# Patient Record
Sex: Female | Born: 1990 | Race: Black or African American | Hispanic: No | Marital: Single | Smoking: Never smoker
Health system: Southern US, Community
[De-identification: ages and names within clinical notes are randomized; demographics above are authoritative.]

## PROBLEM LIST (undated history)

## (undated) DIAGNOSIS — K8020 Calculus of gallbladder without cholecystitis without obstruction: Secondary | ICD-10-CM

## (undated) DIAGNOSIS — D509 Iron deficiency anemia, unspecified: Secondary | ICD-10-CM

## (undated) DIAGNOSIS — K760 Fatty (change of) liver, not elsewhere classified: Secondary | ICD-10-CM

## (undated) DIAGNOSIS — K219 Gastro-esophageal reflux disease without esophagitis: Secondary | ICD-10-CM

## (undated) DIAGNOSIS — E119 Type 2 diabetes mellitus without complications: Secondary | ICD-10-CM

## (undated) DIAGNOSIS — E669 Obesity, unspecified: Secondary | ICD-10-CM

## (undated) HISTORY — PX: BREAST SURGERY: SHX581

## (undated) HISTORY — DX: Obesity, unspecified: E66.9

## (undated) HISTORY — DX: Gastro-esophageal reflux disease without esophagitis: K21.9

## (undated) HISTORY — DX: Calculus of gallbladder without cholecystitis without obstruction: K80.20

## (undated) HISTORY — DX: Iron deficiency anemia, unspecified: D50.9

## (undated) HISTORY — DX: Fatty (change of) liver, not elsewhere classified: K76.0

---

## 2017-12-18 ENCOUNTER — Emergency Department (HOSPITAL_COMMUNITY): Payer: Self-pay

## 2017-12-18 ENCOUNTER — Encounter (HOSPITAL_COMMUNITY): Payer: Self-pay

## 2017-12-18 ENCOUNTER — Emergency Department (HOSPITAL_COMMUNITY)
Admission: EM | Admit: 2017-12-18 | Discharge: 2017-12-19 | Disposition: A | Discharge status: Home / Self Care | Payer: Self-pay | Attending: Emergency Medicine | Admitting: Emergency Medicine

## 2017-12-18 ENCOUNTER — Other Ambulatory Visit: Payer: Self-pay

## 2017-12-18 DIAGNOSIS — W231XXA Caught, crushed, jammed, or pinched between stationary objects, initial encounter: Secondary | ICD-10-CM | POA: Insufficient documentation

## 2017-12-18 DIAGNOSIS — Y929 Unspecified place or not applicable: Secondary | ICD-10-CM | POA: Insufficient documentation

## 2017-12-18 DIAGNOSIS — S92412A Displaced fracture of proximal phalanx of left great toe, initial encounter for closed fracture: Secondary | ICD-10-CM | POA: Insufficient documentation

## 2017-12-18 DIAGNOSIS — Y990 Civilian activity done for income or pay: Secondary | ICD-10-CM | POA: Insufficient documentation

## 2017-12-18 DIAGNOSIS — Y939 Activity, unspecified: Secondary | ICD-10-CM | POA: Insufficient documentation

## 2017-12-18 MED ORDER — LIDOCAINE HCL (PF) 1 % IJ SOLN
5.0000 mL | Freq: Once | INTRAMUSCULAR | Status: AC
  Administered 2017-12-18: 5 mL
  Filled 2017-12-18: qty 30

## 2017-12-18 MED ORDER — OXYCODONE-ACETAMINOPHEN 5-325 MG PO TABS
1.0000 | ORAL_TABLET | Freq: Once | ORAL | Status: AC
  Administered 2017-12-18: 1 via ORAL
  Filled 2017-12-18: qty 1

## 2017-12-18 NOTE — ED Notes (Signed)
Bed: WTR6 Expected date:  Expected time:  Means of arrival:  Comments: EMS 27 yo female from triage-tripped over a pallet-toe injury

## 2017-12-18 NOTE — ED Provider Notes (Signed)
[facility]-EMERGENCY DEPT Provider Note   CSN: [id number] Arrival date & time: 12/18/17  2114     History   Chief Complaint Chief Complaint  Patient presents with  . Foot Pain    HPI Norma Bender is a 27 y.o. female who presents the emergency department today for left great toe pain that began around 8:30 PM tonight.  Patient states that she was at work and she "tripped" over a pallet and dropped a box that hit her in her left great toe.  She notes that she now has a deformity of the left great toe with a large amount of pain.  Pain is worse with palpation and also attempts for movement.  She denies any numbness/tingling.  She was transferred to EMS and ice has been applied.  No other interventions prior to arrival.  No open wounds.  HPI  No past medical history on file.  There are no active problems to display for this patient.  No significant past medical history reported  OB History   None      Home Medications    Prior to Admission medications   Not on File    Family History No family history on file.  Social History Social History   Tobacco Use  . Smoking status: Not on file  Substance Use Topics  . Alcohol use: Never    Frequency: Never  . Drug use: Never     Allergies   Patient has no known allergies.   Review of Systems Review of Systems  All other systems reviewed and are negative.    Physical Exam Updated Vital Signs BP 136/76 (BP Location: Left Arm)   Pulse 91   Temp 98.2 F (36.8 C) (Oral)   Resp 18   Ht  (1.676 m)   Wt 104.3 kg (230 lb)   LMP 12/04/2017 (Approximate)   SpO2 99%   BMI 37.12 kg/m   Physical Exam  Constitutional: She appears well-developed and well-nourished.  HENT:  Head: Normocephalic and atraumatic.  Right Ear: External ear normal.  Left Ear: External ear normal.  Eyes: Conjunctivae are normal. Right eye exhibits no discharge. Left eye exhibits no discharge. No scleral  icterus.  Cardiovascular:  Pulses:      Dorsalis pedis pulses are 2+ on the left side.       Posterior tibial pulses are 2+ on the left side.  Pulmonary/Chest: Effort normal. No respiratory distress.  Musculoskeletal:       Left ankle: Normal. Achilles tendon normal.  Left great toe with angulated deformity without break of the skin.  Tenderness palpation over deformity.  Normal sensation distal to this.  Good cap refill less than 2 seconds.  Dorsalis pedis and posterior tib pulses 2+.  No tenderness palpation of the remainder of the left foot.  Neurological: She is alert. No sensory deficit.  Normal sensation distal to the injury  Skin: Skin is warm and dry. Capillary refill takes less than 2 seconds. No pallor.  Skin intact  Psychiatric: She has a normal mood and affect.  Nursing note and vitals reviewed.    ED Treatments / Results  Labs (all labs ordered are listed, but only abnormal results are displayed) Labs Reviewed - No data to display  EKG None  Radiology Dg Foot Complete Left  Result Date: 12/18/2017 CLINICAL DATA:  Second postreduction EXAM: LEFT FOOT - COMPLETE 3+ VIEW COMPARISON:  12/18/2017 FINDINGS: Comminuted fracture of the proximal phalanx of the left  first toe again identified. There is improved alignment since the previous study with some residual dorsal angulation of the distal fracture fragments. IMPRESSION: Fractures of the proximal phalanx left first toe with improved alignment. Some residual dorsal angulation of the distal fracture fragments is identified. Electronically Signed   By: Burman Nieves M.D.   On: 12/18/2017 23:53   Dg Foot Complete Left  Result Date: 12/18/2017 CLINICAL DATA:  Left big toe fracture status post reduction EXAM: LEFT FOOT - COMPLETE 3+ VIEW COMPARISON:  None. FINDINGS: Mildly comminuted fracture of distal aspect of first proximal phalanx with a fracture cleft extending to the articular surface of the first IP joint. Mild apex  lateral angulation. No other fracture or dislocation. No aggressive osseous lesion. Soft tissue swelling of the great toe. IMPRESSION: 1. Mildly comminuted fracture of distal aspect of first proximal phalanx with a fracture cleft extending to the articular surface of the first IP joint and mild apex lateral angulation. Electronically Signed   By: Elige Ko   On: 12/18/2017 23:36   Dg Foot Complete Left  Result Date: 12/18/2017 CLINICAL DATA:  Fall with left great toe injury EXAM: LEFT FOOT - COMPLETE 3+ VIEW COMPARISON:  None. FINDINGS: There is a comminuted fracture of the distal aspect of the proximal phalanx of left great toe that extends to the articular surface of the first interphalangeal joint. There is mild dorsal and lateral angulation. IMPRESSION: Comminuted, dorsally and laterally angulated, intra-articular fracture of the head of the first proximal phalanx of the left foot. Electronically Signed   By: Deatra Robinson M.D.   On: 12/18/2017 21:53    Procedures Reduction of fracture Date/Time: 12/19/2017 12:14 AM Performed by: Jacinto Halim, PA-C Authorized by: Jacinto Halim, PA-C  Consent: Verbal consent obtained. Risks and benefits: risks, benefits and alternatives were discussed Consent given by: patient Patient understanding: patient states understanding of the procedure being performed Patient consent: the patient's understanding of the procedure matches consent given Test results: test results available and properly labeled Site marked: the operative site was marked Imaging studies: imaging studies available Patient identity confirmed: verbally with patient Time out: Immediately prior to procedure a "time out" was called to verify the correct patient, procedure, equipment, support staff and site/side marked as required. Local anesthesia used: Digital block.  Anesthesia: Local anesthesia used: Digital block.  Sedation: Patient sedated: no  Patient tolerance:  Patient tolerated the procedure well with no immediate complications Comments: Successful improvement of alignment status post reduction.  Celedonio Miyamoto Block Date/Time: 12/19/2017 12:16 AM Performed by: Jacinto Halim, PA-C Authorized by: Jacinto Halim, PA-C   Consent:    Consent obtained:  Verbal   Consent given by:  Patient   Risks discussed:  Allergic reaction, bleeding, intravenous injection, infection, nerve damage, pain, unsuccessful block and swelling   Alternatives discussed:  No treatment Indications:    Indications:  Pain relief and procedural anesthesia Location:    Body area:  Lower extremity   Lower extremity nerve blocked: Left great toe digital block.   Laterality:  Left Pre-procedure details:    Skin preparation:  Alcohol Procedure details (see MAR for exact dosages):    Block needle gauge:  25 G   Anesthetic injected:  Lidocaine 1% w/o epi   Injection procedure:  Anatomic landmarks identified, anatomic landmarks palpated, introduced needle, incremental injection and negative aspiration for blood   Paresthesia:  None Post-procedure details:    Dressing:  None   Outcome:  Anesthesia achieved  Patient tolerance of procedure:  Tolerated well, no immediate complications   (including critical care time)  Medications Ordered in ED Medications  oxyCODONE-acetaminophen (PERCOCET/ROXICET) 5-325 MG per tablet 1 tablet (1 tablet Oral Given 12/18/17 2220)  lidocaine (PF) (XYLOCAINE) 1 % injection 5 mL (5 mLs Other Given by Other 12/18/17 2354)     Initial Impression / Assessment and Plan / ED Course  I have reviewed the triage vital signs and the nursing notes.  Pertinent labs & imaging results that were available during my care of the patient were reviewed by me and considered in my medical decision making (see chart for details).     27 y.o. female with closed, neurovascularly intact comminuted, dorsally and laterally angulated, intra-articular fracture of the head  of the first proximal phalanx of the left foot. Discussed case with Dr. Carola Frost who recommended digital block with reduction and follow up in his office on Wednesday.   Block performed with successful reduction and improvement of alignment with postreduction films.  Patient remains neurovascular intact.  Buddy tape was applied and patient was placed in a postop shoe.   Will discharge the patient home with a short course of pain medication until she is able to follow-up in Dr. Sherolyn Buba.  Will provide note for work. Specific return precautions discussed. Time was given for all questions to be answered. The patient verbalized understanding and agreement with plan. The patient appears safe for discharge home.  Final Clinical Impressions(s) / ED Diagnoses   Final diagnoses:  Closed displaced fracture of proximal phalanx of left great toe, initial encounter    ED Discharge Orders    None       Princella Pellegrini 12/19/17 0018    Bethann Berkshire, MD 12/19/17 [phone]

## 2017-12-18 NOTE — ED Triage Notes (Addendum)
Per EMS report:  Pt was at work [employer] and tripped over a pallet and felt a sharp pain to LT foot.  LT great toe is deformed but no bleeding noted.  Ice and elevation by EMS.  Was "hopping" around on scene.  Pedal pulses palpable by EMS.   Pt stated she was wearing steel-toed boots

## 2017-12-19 MED ORDER — OXYCODONE-ACETAMINOPHEN 5-325 MG PO TABS: 1.0000 | ORAL_TABLET | Freq: Four times a day (QID) | ORAL | 0 refills | Status: DC | PRN

## 2017-12-19 NOTE — Discharge Instructions (Addendum)
You were seen here today for a comminuted, dorsally and laterally angulated, intra-articular fracture of the head of the first proximal phalanx of the left foot. You received reduction with improvement of alignment in the department.  I would like you to please call Dr. Magdalene Patricia office to schedule a follow-up appointment on Wednesday.  Please remain in post op shoe until follow up.  Please follow RICE therapy. ICE the area 3 times per day for at least 20 minutes. Elevate above the level of the heart anytime you are not using it.   HOW TO MAKE AN ICE PACK  To make an ice pack, do one of the following:  Place crushed ice or a bag of frozen vegetables in a sealable plastic bag. Squeeze out the excess air. Place this bag inside another plastic bag. Slide the bag into a pillowcase or place a damp towel between your skin and the bag.  Mix 3 parts water with 1 part rubbing alcohol. Freeze the mixture in a sealable plastic bag. When you remove the mixture from the freezer, it will be slushy. Squeeze out the excess air. Place this bag inside another plastic bag. Slide the bag into a pillowcase or place a damp towel between your skin and the ice pack.   For breakthrough pain you may take Percocet. Do not drink alcohol drive or operate heavy machinery when taking. You are being provided a prescription for opiates (also known as narcotics) for pain control on an ?as needed? basis.  Opiates can be addictive and should only be used when absolutely necessary for pain control when other alternatives do not work.  We recommend you only use them for the recommended amount of time and only as prescribed.  Please do not take with other sedative medications or alcohol.  Please do not drive, operate machinery, or make important decisions while taking opiates.  Please note that these medications can be addictive and have high abuse potential.  Please keep these medications locked away from children, teenagers or any family  members with history of substance abuse. Additionally, these medications may cause constipation - take over the counter stool softeners or add fiber to your diet to treat this (Metamucil, Psyllium Fiber, Colace, Miralax) Further refills will need to be obtained from your primary care doctor and will not be prescribed through the Emergency Department. You will test positive on most drug tests while taking this medication.   If you develop worsening or new concerning symptoms you can return to the emergency department for re-evaluation.

## 2017-12-19 NOTE — Consult Note (Signed)
[facility] Consultation  Reason for Consult: Left great toe deformity Referring Physician: Valeria Batman, MD and Leary Roca, PA-C  Norma Bender is an 27 y.o. female.  HPI: Patient sustained blow to left foot from a wood pallet then heavy box or crate that landed directly on her great toe. She was wearing protective work boots at the time. Patient has since undergone digital block and attempt at reduction with Leary Roca, PA-C. Substantial improvement in alignment but deformity and malrotation persists. Patient denies other injuries. She also denies smoking or other significant medical problems.  No past medical history on file.  No family history on file.  Social History:  reports that she does not drink alcohol or use drugs. Her tobacco history is not on file.  Allergies: Allergies no known allergies  Medications: Prior to Admission: None  No results found for this or any previous visit (from the past 48 hour(s)).  Dg Foot Complete Left  Result Date: 12/18/2017 CLINICAL DATA:  Second postreduction EXAM: LEFT FOOT - COMPLETE 3+ VIEW COMPARISON:  12/18/2017 FINDINGS: Comminuted fracture of the proximal phalanx of the left first toe again identified. There is improved alignment since the previous study with some residual dorsal angulation of the distal fracture fragments. IMPRESSION: Fractures of the proximal phalanx left first toe with improved alignment. Some residual dorsal angulation of the distal fracture fragments is identified. Electronically Signed   By: Burman Nieves M.D.   On: 12/18/2017 23:53   Dg Foot Complete Left  Result Date: 12/18/2017 CLINICAL DATA:  Left big toe fracture status post reduction EXAM: LEFT FOOT - COMPLETE 3+ VIEW COMPARISON:  None. FINDINGS: Mildly comminuted fracture of distal aspect of first proximal phalanx with a fracture cleft extending to the articular surface of the first IP joint. Mild apex lateral angulation. No other  fracture or dislocation. No aggressive osseous lesion. Soft tissue swelling of the great toe. IMPRESSION: 1. Mildly comminuted fracture of distal aspect of first proximal phalanx with a fracture cleft extending to the articular surface of the first IP joint and mild apex lateral angulation. Electronically Signed   By: Elige Ko   On: 12/18/2017 23:36   Dg Foot Complete Left  Result Date: 12/18/2017 CLINICAL DATA:  Fall with left great toe injury EXAM: LEFT FOOT - COMPLETE 3+ VIEW COMPARISON:  None. FINDINGS: There is a comminuted fracture of the distal aspect of the proximal phalanx of left great toe that extends to the articular surface of the first interphalangeal joint. There is mild dorsal and lateral angulation. IMPRESSION: Comminuted, dorsally and laterally angulated, intra-articular fracture of the head of the first proximal phalanx of the left foot. Electronically Signed   By: Deatra Robinson M.D.   On: 12/18/2017 21:53    ROS noncontributory with no recent fever, bleeding abnormalities, urologic dysfunction, GI problems, or weight gain reported.  Blood pressure 139/79, pulse 78, temperature 99 F (37.2 C), temperature source Oral, resp. rate 18, height  (1.676 m), weight 104.3 kg (230 lb), last menstrual period 12/04/2017, SpO2 98 %. 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 Will see in the office on Wednesday for new films. Probable continuation of nonoperative managemnt  Myrene Galas, MD [facility], [facility] [phone]  12/19/2017  10:41 AM

## 2018-12-23 IMAGING — CR DG FOOT COMPLETE 3+V*L*
3 series · 3 of 3 positions shown · non-contrast
Comparison: 12/18/2017

CLINICAL DATA: Second postreduction

EXAM:
LEFT FOOT - COMPLETE 3+ VIEW

[x foot ap left]
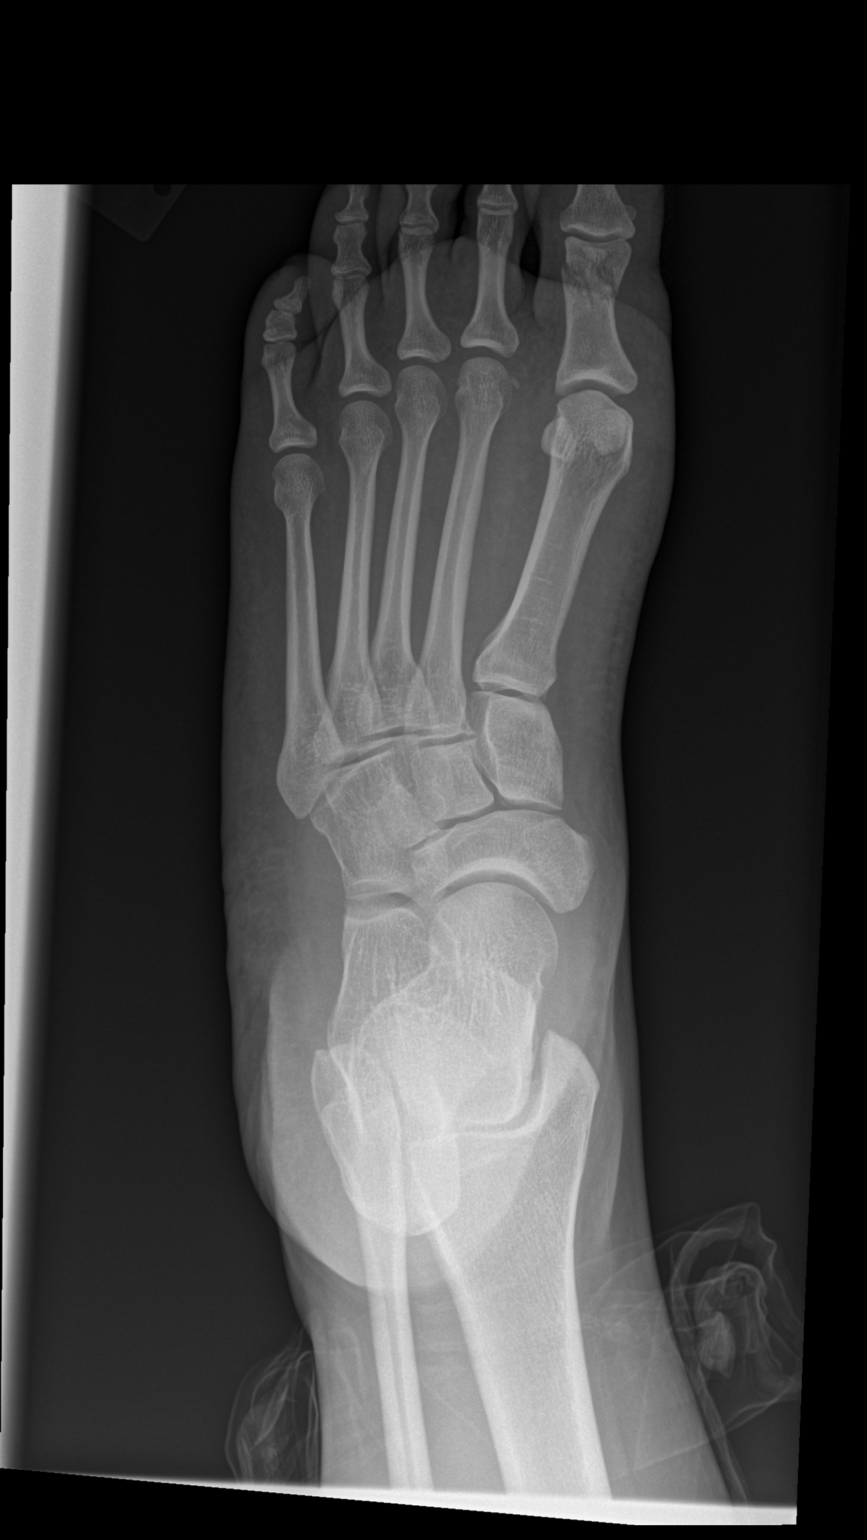

[x foot obl left]
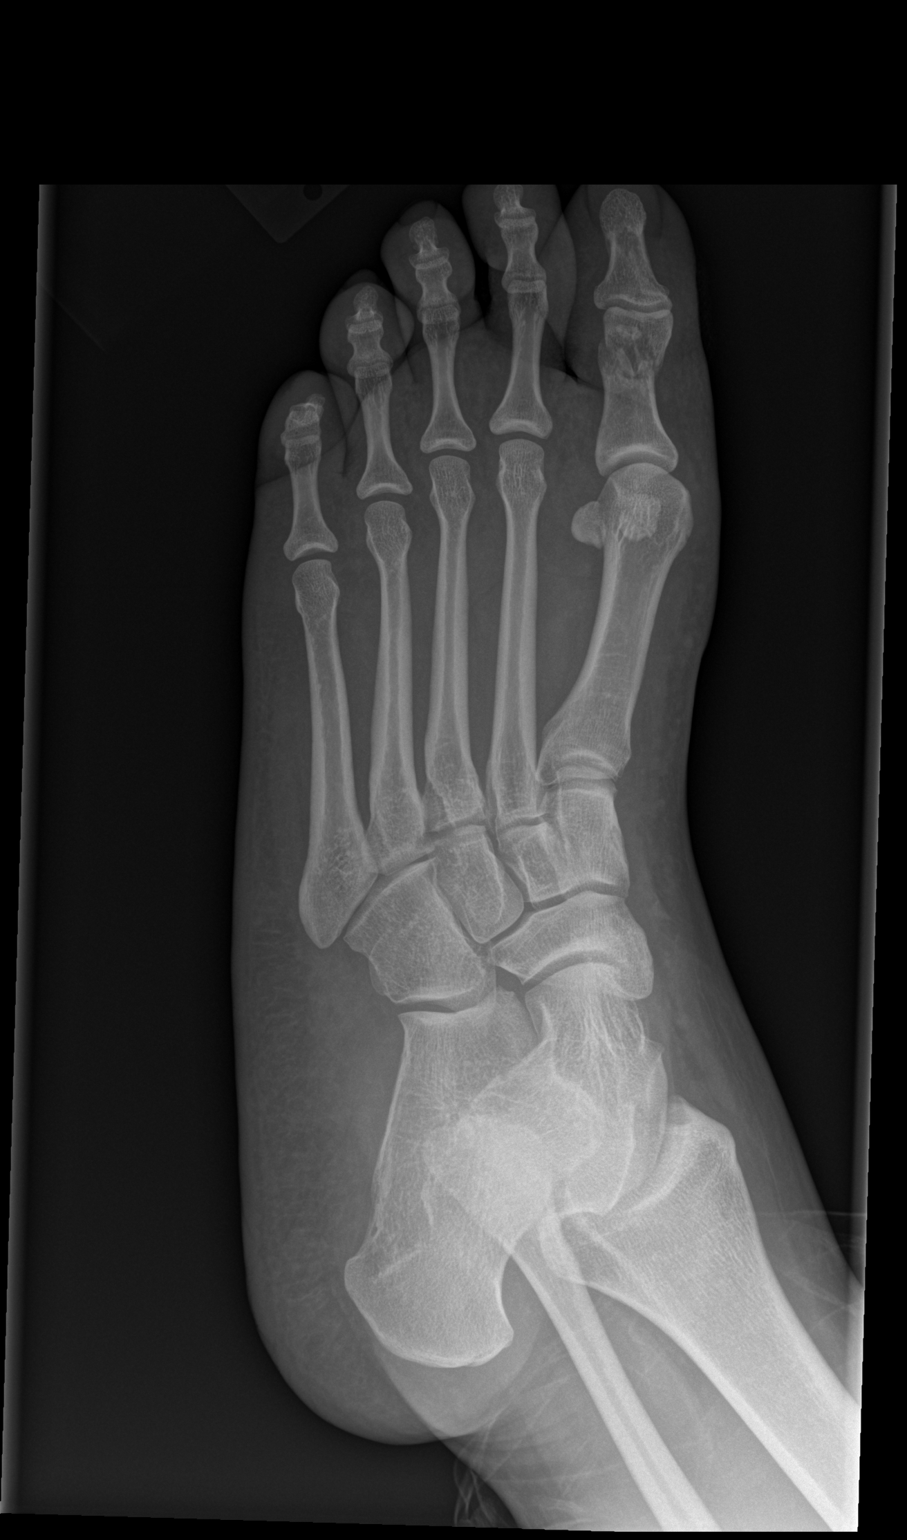

[x foot lat left]
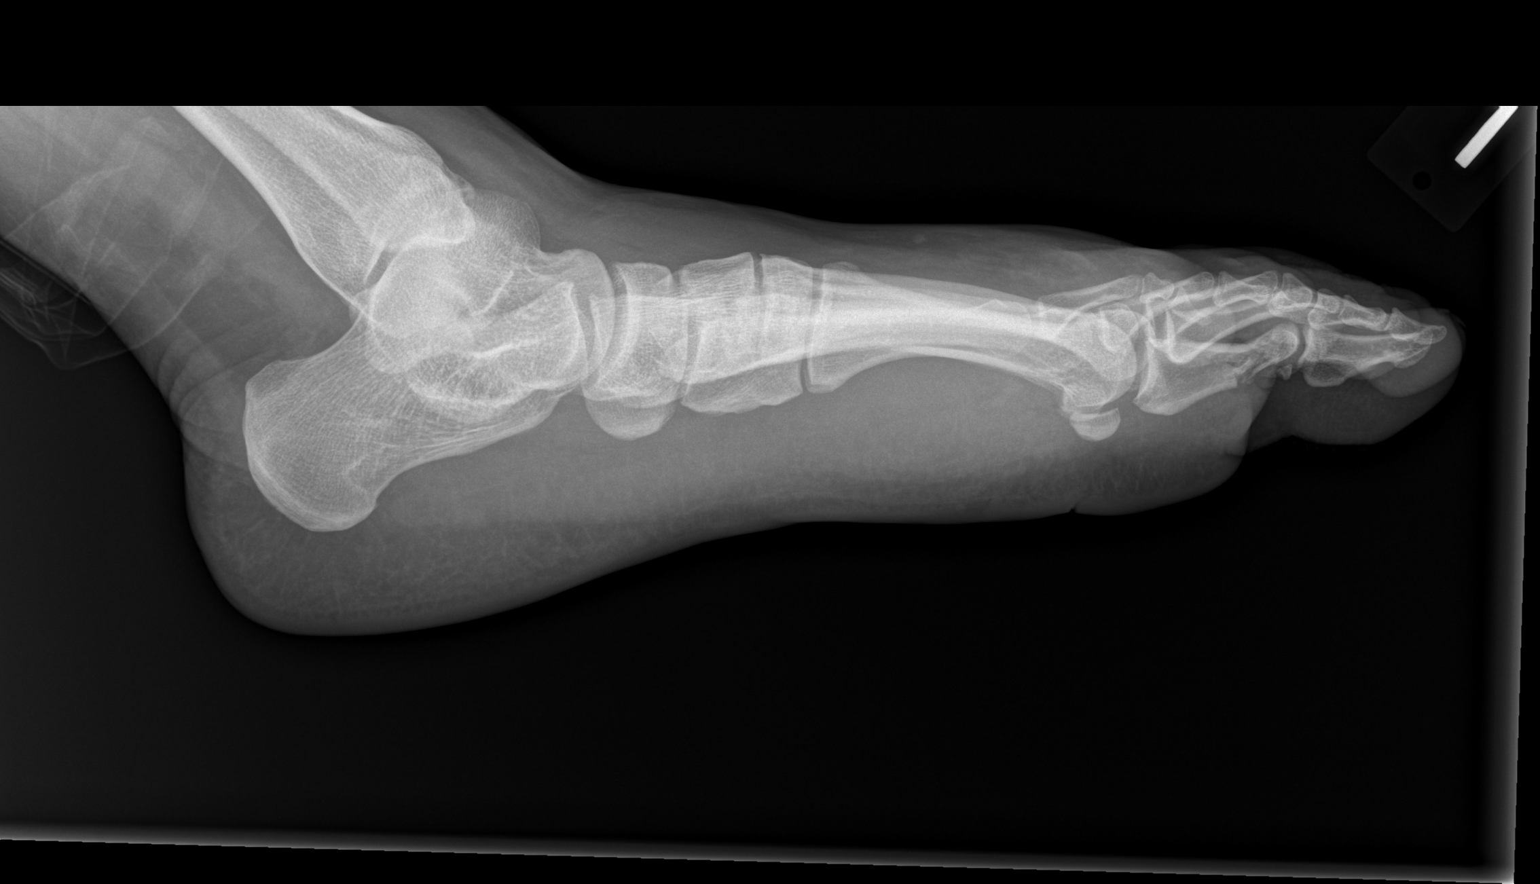

[3 of 3 positions shown; findings below may reference images not displayed]

FINDINGS: Comminuted fracture of the proximal phalanx of the left first toe
again identified. There is improved alignment since the previous
study with some residual dorsal angulation of the distal fracture
fragments.
IMPRESSION: Fractures of the proximal phalanx left first toe with improved
alignment. Some residual dorsal angulation of the distal fracture
fragments is identified.

## 2018-12-23 IMAGING — CR DG FOOT COMPLETE 3+V*L*
3 series · 3 of 3 positions shown · non-contrast
Comparison: None.

CLINICAL DATA: Left big toe fracture status post reduction

EXAM:
LEFT FOOT - COMPLETE 3+ VIEW

[x foot ap left]
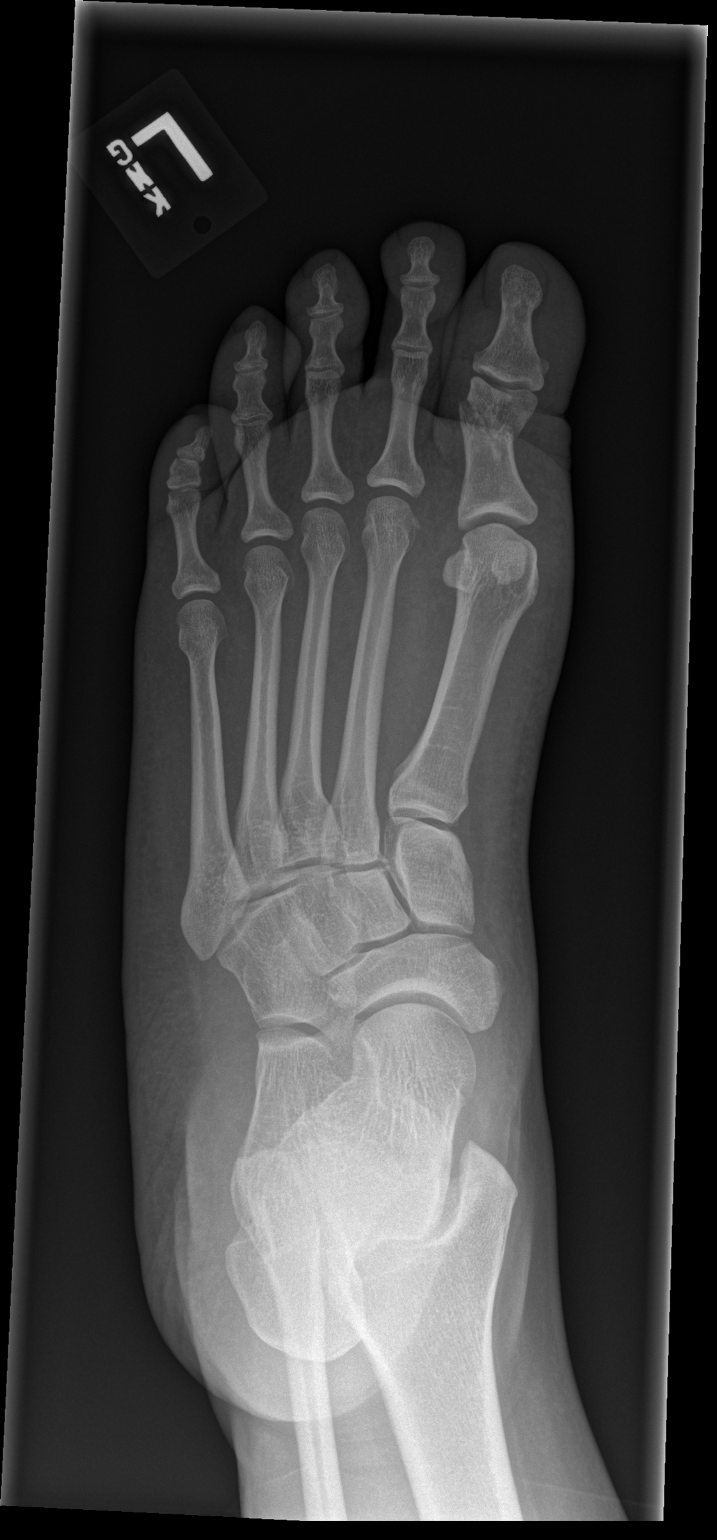

[x foot obl left]
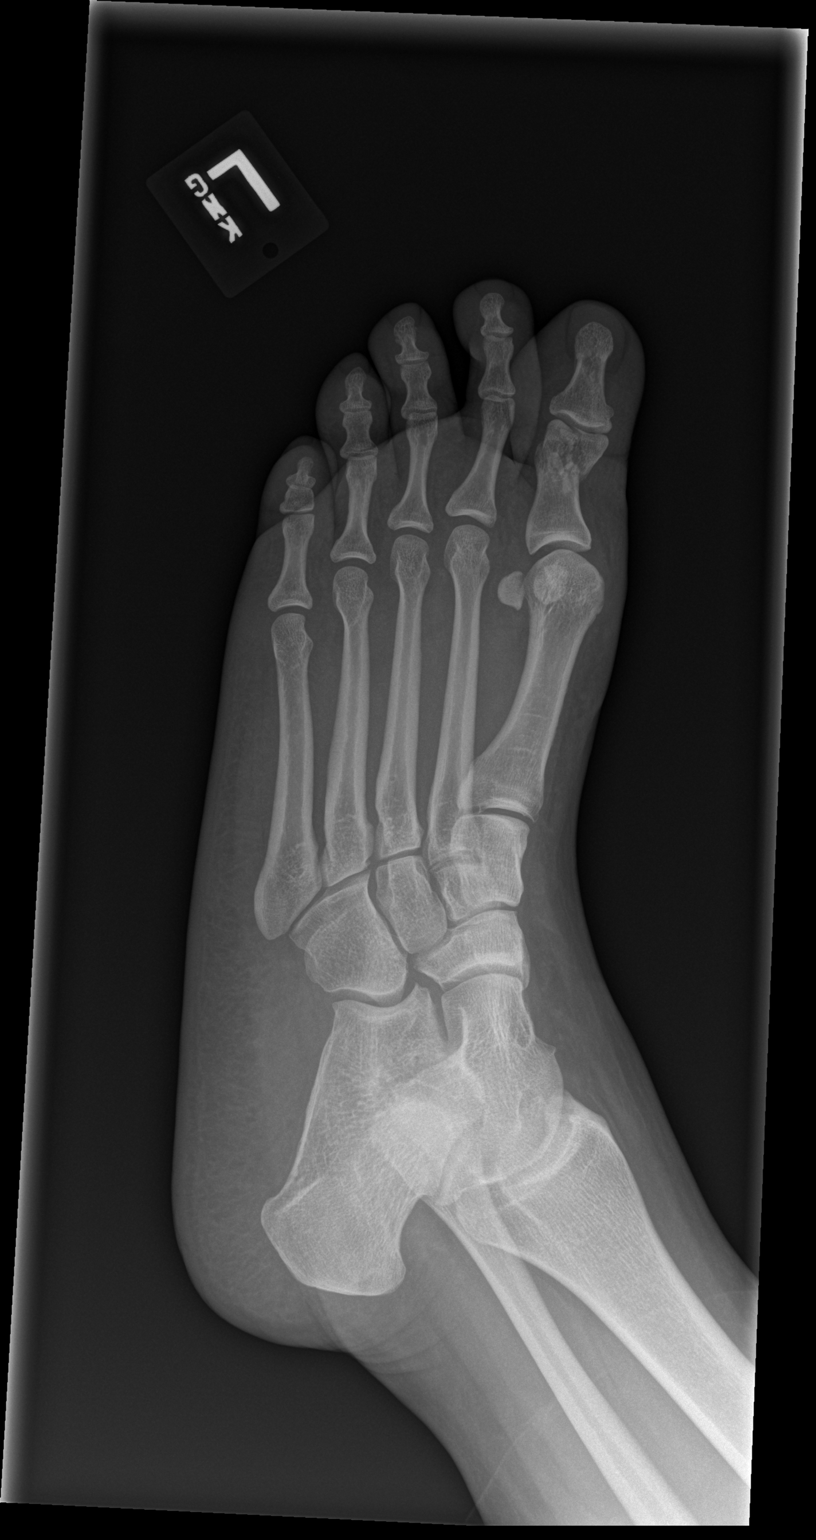

[x foot lat left]
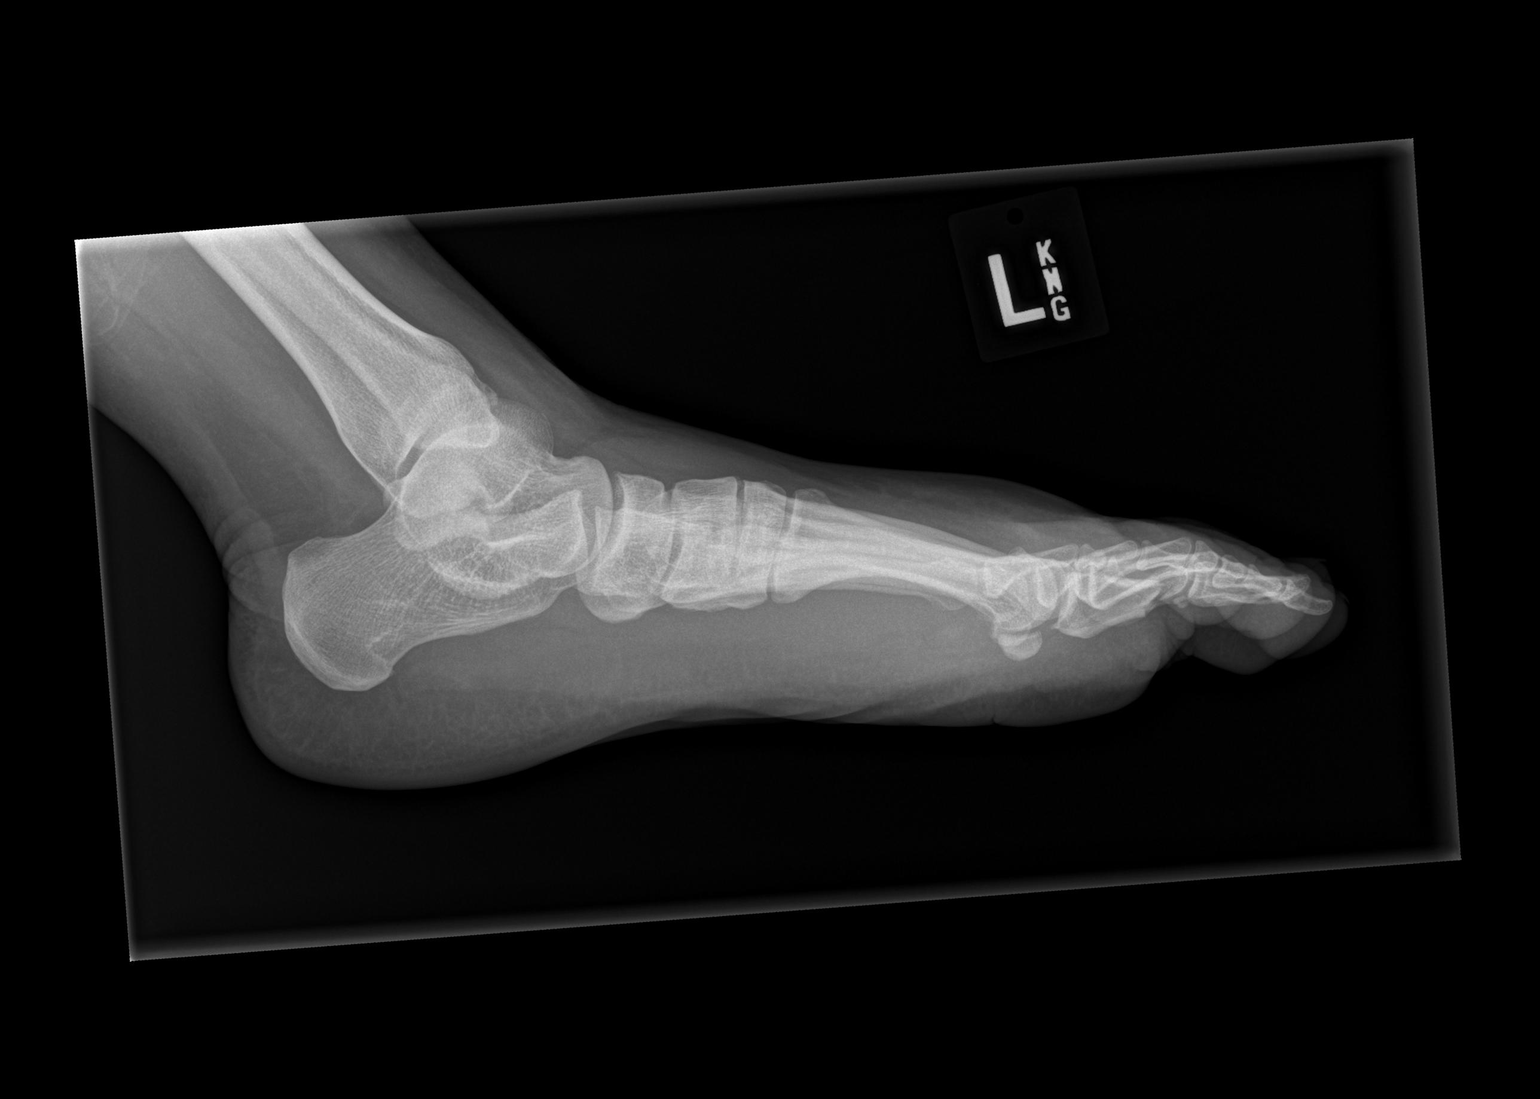

[3 of 3 positions shown; findings below may reference images not displayed]

FINDINGS: Mildly comminuted fracture of distal aspect of first proximal
phalanx with a fracture cleft extending to the articular surface of
the first IP joint. Mild apex lateral angulation.

No other fracture or dislocation. No aggressive osseous lesion. Soft
tissue swelling of the great toe.
IMPRESSION: 1. Mildly comminuted fracture of distal aspect of first proximal
phalanx with a fracture cleft extending to the articular surface of
the first IP joint and mild apex lateral angulation.

## 2022-10-02 ENCOUNTER — Other Ambulatory Visit (HOSPITAL_COMMUNITY): Payer: Self-pay | Admitting: Orthopedic Surgery

## 2022-10-02 DIAGNOSIS — M25561 Pain in right knee: Secondary | ICD-10-CM

## 2022-10-14 ENCOUNTER — Ambulatory Visit (HOSPITAL_COMMUNITY): Payer: BC Managed Care – PPO | Attending: Orthopedic Surgery

## 2022-10-14 ENCOUNTER — Encounter (HOSPITAL_COMMUNITY): Payer: Self-pay

## 2023-10-09 ENCOUNTER — Encounter (HOSPITAL_BASED_OUTPATIENT_CLINIC_OR_DEPARTMENT_OTHER): Payer: Self-pay | Admitting: Emergency Medicine

## 2023-10-09 ENCOUNTER — Other Ambulatory Visit: Payer: Self-pay

## 2023-10-09 ENCOUNTER — Emergency Department (HOSPITAL_BASED_OUTPATIENT_CLINIC_OR_DEPARTMENT_OTHER)
Admission: EM | Admit: 2023-10-09 | Discharge: 2023-10-09 | Disposition: A | Discharge status: Home / Self Care | Attending: Emergency Medicine | Admitting: Emergency Medicine

## 2023-10-09 DIAGNOSIS — R1010 Upper abdominal pain, unspecified: Secondary | ICD-10-CM | POA: Insufficient documentation

## 2023-10-09 DIAGNOSIS — R109 Unspecified abdominal pain: Secondary | ICD-10-CM

## 2023-10-09 HISTORY — DX: Type 2 diabetes mellitus without complications: E11.9

## 2023-10-09 LAB — COMPREHENSIVE METABOLIC PANEL
ALT: 8 U/L (ref 0–44)
AST: 11 U/L — ABNORMAL LOW (ref 15–41)
Albumin: 4.1 g/dL (ref 3.5–5.0)
Alkaline Phosphatase: 54 U/L (ref 38–126)
Anion gap: 7 (ref 5–15)
BUN: 11 mg/dL (ref 6–20)
CO2: 28 mmol/L (ref 22–32)
Calcium: 8.8 mg/dL — ABNORMAL LOW (ref 8.9–10.3)
Chloride: 103 mmol/L (ref 98–111)
Creatinine, Ser: 0.74 mg/dL (ref 0.44–1.00)
GFR, Estimated: >60 mL/min (ref >60)
Glucose, Bld: 98 mg/dL (ref 70–99)
Potassium: 3.9 mmol/L (ref 3.5–5.1)
Sodium: 138 mmol/L (ref 135–145)
Total Bilirubin: 0.4 mg/dL (ref 0.0–1.2)
Total Protein: 6.6 g/dL (ref 6.5–8.1)

## 2023-10-09 LAB — CBC WITH DIFFERENTIAL/PLATELET
Abs Immature Granulocytes: 0.03 10*3/uL (ref 0.00–0.07)
Basophils Absolute: 0 10*3/uL (ref 0.0–0.1)
Basophils Relative: 0 %
Eosinophils Absolute: 0.1 10*3/uL (ref 0.0–0.5)
Eosinophils Relative: 1 %
HCT: 33.3 % — ABNORMAL LOW (ref 36.0–46.0)
Hemoglobin: 10.2 g/dL — ABNORMAL LOW (ref 12.0–15.0)
Immature Granulocytes: 0 %
Lymphocytes Relative: 22 %
Lymphs Abs: 2.1 10*3/uL (ref 0.7–4.0)
MCH: 23.9 pg — ABNORMAL LOW (ref 26.0–34.0)
MCHC: 30.6 g/dL (ref 30.0–36.0)
MCV: 78 fL — ABNORMAL LOW (ref 80.0–100.0)
Monocytes Absolute: 0.6 10*3/uL (ref 0.1–1.0)
Monocytes Relative: 6 %
Neutro Abs: 6.8 10*3/uL (ref 1.7–7.7)
Neutrophils Relative %: 71 %
Platelets: 357 10*3/uL (ref 150–400)
RBC: 4.27 MIL/uL (ref 3.87–5.11)
RDW: 16.4 % — ABNORMAL HIGH (ref 11.5–15.5)
WBC: 9.7 10*3/uL (ref 4.0–10.5)
nRBC: 0 % (ref 0.0–0.2)

## 2023-10-09 LAB — URINALYSIS, ROUTINE W REFLEX MICROSCOPIC
Bilirubin Urine: NEGATIVE
Glucose, UA: NEGATIVE mg/dL
Hgb urine dipstick: NEGATIVE
Ketones, ur: NEGATIVE mg/dL
Nitrite: NEGATIVE
Specific Gravity, Urine: 1.029 (ref 1.005–1.030)
pH: 6.5 (ref 5.0–8.0)

## 2023-10-09 LAB — LIPASE, BLOOD: Lipase: <10 U/L — ABNORMAL LOW (ref 11–51)

## 2023-10-09 LAB — PREGNANCY, URINE: Preg Test, Ur: NEGATIVE

## 2023-10-09 MED ORDER — PANTOPRAZOLE SODIUM 20 MG PO TBEC: 20.0000 mg | DELAYED_RELEASE_TABLET | Freq: Every day | ORAL | 0 refills | Status: DC

## 2023-10-09 MED ORDER — ALUM & MAG HYDROXIDE-SIMETH 200-200-20 MG/5ML PO SUSP
30.0000 mL | Freq: Once | ORAL | Status: AC
  Administered 2023-10-09: 30 mL via ORAL
  Filled 2023-10-09: qty 30

## 2023-10-09 MED ORDER — FAMOTIDINE 20 MG PO TABS
20.0000 mg | ORAL_TABLET | Freq: Once | ORAL | Status: AC
  Administered 2023-10-09: 20 mg via ORAL
  Filled 2023-10-09: qty 1

## 2023-10-09 NOTE — ED Provider Notes (Signed)
 [facility] EMERGENCY DEPARTMENT AT [facility] Provider Note   CSN: [id number] Arrival date & time: 10/09/23  2113     History Chief Complaint  Patient presents with   Abdominal Pain    HPI Norma Bender is a 33 y.o. female presenting for episode of sever abdominal pain last night. States that she has had episodes of this for the past few months.  Last night tried a new red wine and symptoms became more pronounced.  Patient's recorded medical, surgical, social, medication list and allergies were reviewed in the Snapshot window as part of the initial history.   Review of Systems   Review of Systems  Constitutional:  Negative for chills and fever.  HENT:  Negative for ear pain and sore throat.   Eyes:  Negative for pain and visual disturbance.  Respiratory:  Negative for cough and shortness of breath.   Cardiovascular:  Negative for chest pain and palpitations.  Gastrointestinal:  Positive for abdominal pain and nausea. Negative for diarrhea and vomiting.  Genitourinary:  Negative for dysuria and hematuria.  Musculoskeletal:  Negative for arthralgias and back pain.  Skin:  Negative for color change and rash.  Neurological:  Negative for seizures and syncope.  All other systems reviewed and are negative.   Physical Exam Updated Vital Signs BP 135/86   Pulse 94   Temp 99.3 F (37.4 C) (Oral)   Resp 20   SpO2 96%  Physical Exam Vitals and nursing note reviewed.  Constitutional:      General: She is not in acute distress.    Appearance: She is well-developed.  HENT:     Head: Normocephalic and atraumatic.  Eyes:     Conjunctiva/sclera: Conjunctivae normal.  Cardiovascular:     Rate and Rhythm: Normal rate and regular rhythm.     Heart sounds: No murmur heard. Pulmonary:     Effort: Pulmonary effort is normal. No respiratory distress.     Breath sounds: Normal breath sounds.  Abdominal:     General: There is no distension.     Palpations: Abdomen is  soft.     Tenderness: There is no abdominal tenderness. There is no right CVA tenderness or left CVA tenderness.  Musculoskeletal:        General: No swelling or tenderness. Normal range of motion.     Cervical back: Neck supple.  Skin:    General: Skin is warm and dry.  Neurological:     General: No focal deficit present.     Mental Status: She is alert and oriented to person, place, and time. Mental status is at baseline.     Cranial Nerves: No cranial nerve deficit.      ED Course/ Medical Decision Making/ A&P    Procedures Procedures   Medications Ordered in ED Medications  alum & mag hydroxide-simeth (MAALOX/MYLANTA) 200-200-20 MG/5ML suspension 30 mL (30 mLs Oral Given 10/09/23 2226)  famotidine (PEPCID) tablet 20 mg (20 mg Oral Given 10/09/23 2226)   Medical Decision Making:   Norma Bender is a 33 y.o. female who presented to the ED today with abdominal pain, detailed above.    Patient placed on continuous vitals and telemetry monitoring while in ED which was reviewed periodically.  Complete initial physical exam performed, notably the patient  was HDS in NAD.     Reviewed and confirmed nursing documentation for past medical history, family history, social history.    Initial Assessment:   With the patient's presentation of abdominal pain,  most likely diagnosis is nonspecific etiology. Other diagnoses were considered including (but not limited to) gastroenteritis, colitis, small bowel obstruction, appendicitis, cholecystitis, pancreatitis, nephrolithiasis, UTI, pyleonephritis. These are considered less likely due to history of present illness and physical exam findings.   This is most consistent with an acute life/limb threatening illness complicated by underlying chronic conditions.   Initial Plan:  CBC/CMP to evaluate for underlying infectious/metabolic etiology for patient's abdominal pain  Lipase to evaluate for pancreatitis  EKG to evaluate for cardiac source of  pain  Considered CTAB/Pelvis with contrast to evaluate for structural/surgical etiology of patients' severe abdominal pain.  Urinalysis and repeat physical assessment to evaluate for UTI/Pyelonpehritis  Empiric management of symptoms with escalating pain control and antiemetics as needed.   Initial Study Results:   Laboratory  All laboratory results reviewed without evidence of clinically relevant pathology.      EKG EKG was reviewed independently. Rate, rhythm, axis, intervals all examined and without medically relevant abnormality. ST segments without concerns for elevations.    Reassessment and plan: Patient observed in the emergency room for 90 minutes. She is now ambulatory and tolerating p.o. intake after therapies here. No acute pathology detected on lab work.  Will refrain from any more aggressive interventional evaluation given benign exam.  Favor likely benign pathology such as viral gastroenteritis, foodborne illness or early developing peptic ulcer disease. She is denying melena or hematochezia for more aggressive lesions.  Will start her on PPI and refer to gastroenterology in the outpatient setting for further care and management.    Clinical Impression:  1. Abdominal pain, unspecified abdominal location      Data Unavailable   Final Clinical Impression(s) / ED Diagnoses Final diagnoses:  Abdominal pain, unspecified abdominal location    Rx / DC Orders ED Discharge Orders          Ordered    pantoprazole (PROTONIX) 20 MG tablet  Daily        10/09/23 2223              Glyn Ade, MD 10/09/23 2254

## 2023-10-09 NOTE — ED Triage Notes (Signed)
 Abdominal pain  Upper abdo X 24 hours Pressure and soreness Not relieved with OTC meds Vomiting last night  Has had similar after eating in the last few month.   Did drink some wine last night which is not typical

## 2023-10-11 ENCOUNTER — Encounter: Payer: Self-pay | Admitting: Physician Assistant

## 2023-12-01 ENCOUNTER — Ambulatory Visit (INDEPENDENT_AMBULATORY_CARE_PROVIDER_SITE_OTHER): Admitting: Physician Assistant

## 2023-12-01 ENCOUNTER — Encounter: Payer: Self-pay | Admitting: Physician Assistant

## 2023-12-01 ENCOUNTER — Other Ambulatory Visit (INDEPENDENT_AMBULATORY_CARE_PROVIDER_SITE_OTHER)

## 2023-12-01 VITALS — BP 116/84 | HR 123 | Ht 66.0 in | Wt 344.8 lb

## 2023-12-01 DIAGNOSIS — D509 Iron deficiency anemia, unspecified: Secondary | ICD-10-CM | POA: Diagnosis not present

## 2023-12-01 DIAGNOSIS — K5900 Constipation, unspecified: Secondary | ICD-10-CM | POA: Diagnosis not present

## 2023-12-01 DIAGNOSIS — R1011 Right upper quadrant pain: Secondary | ICD-10-CM | POA: Diagnosis not present

## 2023-12-01 DIAGNOSIS — R1013 Epigastric pain: Secondary | ICD-10-CM | POA: Diagnosis not present

## 2023-12-01 DIAGNOSIS — D649 Anemia, unspecified: Secondary | ICD-10-CM

## 2023-12-01 LAB — B12 AND FOLATE PANEL
Folate: 15.9 ng/mL (ref >5.9)
Vitamin B-12: 408 pg/mL (ref 211–911)

## 2023-12-01 LAB — CBC WITH DIFFERENTIAL/PLATELET
Basophils Absolute: 0.1 10*3/uL (ref 0.0–0.1)
Basophils Relative: 0.6 % (ref 0.0–3.0)
Eosinophils Absolute: 0.1 10*3/uL (ref 0.0–0.7)
Eosinophils Relative: 0.6 % (ref 0.0–5.0)
HCT: 34.8 % — ABNORMAL LOW (ref 36.0–46.0)
Hemoglobin: 11.1 g/dL — ABNORMAL LOW (ref 12.0–15.0)
Lymphocytes Relative: 19.7 % (ref 12.0–46.0)
Lymphs Abs: 2.1 10*3/uL (ref 0.7–4.0)
MCHC: 31.9 g/dL (ref 30.0–36.0)
MCV: 75.5 fl — ABNORMAL LOW (ref 78.0–100.0)
Monocytes Absolute: 0.8 10*3/uL (ref 0.1–1.0)
Monocytes Relative: 7.2 % (ref 3.0–12.0)
Neutro Abs: 7.6 10*3/uL (ref 1.4–7.7)
Neutrophils Relative %: 71.9 % (ref 43.0–77.0)
Platelets: 361 10*3/uL (ref 150.0–400.0)
RBC: 4.61 Mil/uL (ref 3.87–5.11)
RDW: 18 % — ABNORMAL HIGH (ref 11.5–15.5)
WBC: 10.5 10*3/uL (ref 4.0–10.5)

## 2023-12-01 MED ORDER — PANTOPRAZOLE SODIUM 40 MG PO TBEC: 40.0000 mg | DELAYED_RELEASE_TABLET | Freq: Every day | ORAL | 3 refills | Status: AC

## 2023-12-01 NOTE — Progress Notes (Signed)
 Norma Canard, PA-C [address] [place], [place]  [address] Phone: [phone]   Gastroenterology Consultation  Referring Provider:     No ref. provider found Primary Care Physician:  Patient, No Pcp Per Primary Gastroenterologist:  Norma Canard, PA-C / Norma Elizabeth, MD  Reason for Consultation:     Upper Abdominal pain        Norma Bender is a 33 y.o. y/o female referred for consultation & management  by Patient, No Pcp Per.   She had acute epigastric pain which started several months ago after she drank wine.  Her symptoms resolved within a few days.  6 weeks ago she ate a lot of pizza and drank a bottle of wine.  After that she had a flareup of severe epigastric pain and RUQ pain which lasted several days.  She went to [facility] ED 10/09/2023 to evaluate her upper abdominal pain.  Was given a GI cocktail and prescribed pantoprazole  20 Mg once daily.  She has run out of this medication.  It helped mildly.  She stopped drinking alcohol.  She has not had any more episodes of severe upper abdominal pain.  She continues to have mild intermittent epigastric and RUQ pain.  She admits to chronic constipation.  Has straining to have bowel movement.  She denies vomiting, hematemesis, melena, or hematochezia.  No previous EGD, colonoscopy, or GI evaluation.  No recent abdominal imaging.  10/09/2023 labs: Negative urine pregnancy  Normal CMP and lipase.  CBC showed anemia with hemoglobin 10.2, hematocrit 33, MCV 78.    She denies menorrhagia.  Has a cycle every 2 or 3 months.  Denies NSAID use.  Is not taking iron or B12.  Has morbid obesity with BMI 55.   Past Medical History:  Diagnosis Date   Diabetes (HCC)     No past surgical history on file.  Prior to Admission medications   Medication NONE Sig Start Date End Date Taking? Authorizing Provider     No family history on file.   Social History   Tobacco Use   Smoking status: Never   Smokeless tobacco: Never   Substance Use Topics   Alcohol use: Never   Drug use: Never    Allergies as of 12/01/2023   (No Known Allergies)    Review of Systems:    All systems reviewed and negative except where noted in HPI.   Physical Exam:  BP 116/84   Pulse (!) 123   Ht 5\' 6"  (1.676 m)   Wt (!) 344 lb 12.8 oz (156.4 kg)   BMI 55.65 kg/m  No LMP recorded. (Menstrual status: Irregular Periods).  General:   Alert,  Well-developed, well-nourished, morbidly obese, pleasant and cooperative in NAD Lungs:  Respirations even and unlabored.  Clear throughout to auscultation.   No wheezes, crackles, or rhonchi. No acute distress. Heart:  Regular rate and rhythm; no murmurs, clicks, rubs, or gallops. Abdomen:  Normal bowel sounds.  No bruits.  Soft, and obese without masses, hepatosplenomegaly or hernias noted.  Mild RUQ and epigastric tenderness.  No guarding or rebound tenderness.   No lower abdominal tenderness. Neurologic:  Alert and oriented x3;  grossly normal neurologically. Psych:  Alert and cooperative. Normal mood and affect.  Imaging Studies: No results found.  Labs: CBC    Component Value Date/Time   WBC 9.7 10/09/2023 2210   RBC 4.27 10/09/2023 2210   HGB 10.2 (L) 10/09/2023 2210   HCT 33.3 (L) 10/09/2023  2210   PLT 357 10/09/2023 2210   MCV 78.0 (L) 10/09/2023 2210   MCH 23.9 (L) 10/09/2023 2210   MCHC 30.6 10/09/2023 2210   RDW 16.4 (H) 10/09/2023 2210   LYMPHSABS 2.1 10/09/2023 2210   MONOABS 0.6 10/09/2023 2210   EOSABS 0.1 10/09/2023 2210   BASOSABS 0.0 10/09/2023 2210    CMP     Component Value Date/Time   NA 138 10/09/2023 2210   K 3.9 10/09/2023 2210   CL 103 10/09/2023 2210   CO2 28 10/09/2023 2210   GLUCOSE 98 10/09/2023 2210   BUN 11 10/09/2023 2210   CREATININE 0.74 10/09/2023 2210   CALCIUM 8.8 (L) 10/09/2023 2210   PROT 6.6 10/09/2023 2210   ALBUMIN 4.1 10/09/2023 2210   AST 11 (L) 10/09/2023 2210   ALT 8 10/09/2023 2210   ALKPHOS 54 10/09/2023 2210    BILITOT 0.4 10/09/2023 2210   GFRNONAA >60 10/09/2023 2210    Assessment and Plan:   Norma Bender is a 33 y.o. y/o female has been referred for:  1.  Epigastric Abdominal pain: Differential includes gastritis, peptic ulcer, and GERD. - H. Pylori Stool Test, [facility] - Encouraged her to avoid all alcohol. - Avoid NSAIDs, spicy food, acidic food, and greasy foods. - Rx pantoprazole  40 Mg 1 tablet once daily.  2.  RUQ Pain - RUQ US  - evaluate for gallstones  3.  Microcytic anemia -Labs: CBC, iron panel, ferritin, B12, folate - If she has iron deficiency anemia, then we will need to consider scheduling EGD in hospital (BMI greater than 50). - Not currently on iron or B12.  4.  Mild Constipation. - Recommend High Fiber diet with fruits, vegetables, and whole grains. - Drink 64 ounces of Fluids Daily. - Start Miralax Mix 1 capful in a drink daily.   Follow up 4 weeks with TG.  Norma Canard, PA-C

## 2023-12-01 NOTE — Patient Instructions (Addendum)
 Your provider has requested that you go to the basement level for lab work before leaving today. Press "B" on the elevator. The lab is located at the first door on the left as you exit the elevator.  We have sent the following medications to your pharmacy for you to pick up at your convenience: Pantoprazole  40 mg daily  You have been scheduled for an abdominal ultrasound at [facility] (1st floor of hospital) on 12/02/23 at 9:00am. Please arrive 30 minutes prior to your appointment for registration. Make certain not to have anything to eat or drink after midnight prior to your appointment. Should you need to reschedule your appointment, please contact radiology at [phone]. This test typically takes about 30 minutes to perform.  Recommend High Fiber diet with fruits, vegetables, and whole grains. Drink 64 ounces of Fluids Daily. Start Miralax Mix 1 capful in a drink daily.   Please follow up sooner if symptoms increase or worsen  Due to recent changes in healthcare laws, you may see the results of your imaging and laboratory studies on MyChart before your provider has had a chance to review them.  We understand that in some cases there may be results that are confusing or concerning to you. Not all laboratory results come back in the same time frame and the provider may be waiting for multiple results in order to interpret others.  Please give us  48 hours in order for your provider to thoroughly review all the results before contacting the office for clarification of your results.   _______________________________________________________  If your blood pressure at your visit was 140/90 or greater, please contact your primary care physician to follow up on this.  _______________________________________________________  If you are age 46 or older, your body mass index should be between 23-30. Your Body mass index is 55.65 kg/m. If this is out of the aforementioned range listed,  please consider follow up with your Primary Care Provider.  If you are age 48 or younger, your body mass index should be between 19-25. Your Body mass index is 55.65 kg/m. If this is out of the aformentioned range listed, please consider follow up with your Primary Care Provider.   ________________________________________________________  The [facility] providers would like to encourage you to use MYCHART to communicate with providers for non-urgent requests or questions.  Due to long hold times on the telephone, sending your provider a message by [facility] may be a faster and more efficient way to get a response.  Please allow 48 business hours for a response.  Please remember that this is for non-urgent requests.  _______________________________________________________ Thank you for trusting me with your gastrointestinal care!   Brigitte Canard, PA

## 2023-12-02 ENCOUNTER — Ambulatory Visit (HOSPITAL_COMMUNITY)
Admission: RE | Admit: 2023-12-02 | Discharge: 2023-12-02 | Disposition: A | Discharge status: Home / Self Care | Source: Ambulatory Visit | Attending: Physician Assistant | Admitting: Physician Assistant

## 2023-12-02 DIAGNOSIS — R1013 Epigastric pain: Secondary | ICD-10-CM | POA: Insufficient documentation

## 2023-12-02 DIAGNOSIS — R1011 Right upper quadrant pain: Secondary | ICD-10-CM | POA: Insufficient documentation

## 2023-12-02 LAB — IRON,TIBC AND FERRITIN PANEL
%SAT: 6 % — ABNORMAL LOW (ref 16–45)
Ferritin: 14 ng/mL — ABNORMAL LOW (ref 16–154)
Iron: 23 ug/dL — ABNORMAL LOW (ref 40–190)
TIBC: 389 ug/dL (ref 250–450)

## 2023-12-20 NOTE — Progress Notes (Signed)
 Agree with the assessment and plan as outlined by Brigitte Canard, PA-C.  If H.pylori test positive, this would be a likely cause of IDA, and would defer endoscopic evaluation until H.pylori eradicated and anemia reassessed.  Would also consider serologic testing for celiac disease given her GI symptoms and IDA

## 2023-12-21 ENCOUNTER — Telehealth: Payer: Self-pay | Admitting: Physician Assistant

## 2023-12-21 DIAGNOSIS — D649 Anemia, unspecified: Secondary | ICD-10-CM

## 2023-12-21 DIAGNOSIS — R1013 Epigastric pain: Secondary | ICD-10-CM

## 2023-12-21 NOTE — Telephone Encounter (Signed)
 Editor: Elois Hair, MD (Physician)  Agree with the assessment and plan as outlined by Brigitte Canard, PA-C.  If H.pylori test positive, this would be a likely cause of IDA, and would defer endoscopic evaluation until H.pylori eradicated and anemia reassessed.  Would also consider serologic testing for celiac disease given her GI symptoms and IDA -----------------------------------------------  **Please Give patient [facility] H. Pylori Stool Test to complete and Order Celiac Panal labs.  Diagnosis: Iron Deficiency Anemia and Epigastric Pain.  Advise pt. To keep f/u OV with me 01/07/24. Brigitte Canard, PA-C

## 2023-12-21 NOTE — Addendum Note (Signed)
 Addended by: Glennette Lanius on: 12/21/2023 10:14 AM   Modules accepted: Orders

## 2023-12-21 NOTE — Telephone Encounter (Signed)
 I have spoken to patient to advise of additional recommendation by Dr Cherryl Corona for her to have H Pylori stool testing and celiac lab work completed. Patient states she will come for labs and stool kit pick up on Thursday or Friday. H Pylori [facility] kit placed at 3rd floor front desk for patient to pick up.

## 2023-12-22 ENCOUNTER — Other Ambulatory Visit

## 2023-12-22 DIAGNOSIS — R1013 Epigastric pain: Secondary | ICD-10-CM

## 2023-12-22 DIAGNOSIS — D649 Anemia, unspecified: Secondary | ICD-10-CM

## 2023-12-23 LAB — TISSUE TRANSGLUTAMINASE, IGA: (tTG) Ab, IgA: <1 U/mL

## 2023-12-23 LAB — IGA: Immunoglobulin A: 136 mg/dL (ref 47–310)

## 2023-12-24 ENCOUNTER — Ambulatory Visit: Payer: Self-pay | Admitting: Physician Assistant

## 2023-12-26 DIAGNOSIS — D509 Iron deficiency anemia, unspecified: Secondary | ICD-10-CM

## 2023-12-26 HISTORY — DX: Iron deficiency anemia, unspecified: D50.9

## 2023-12-29 ENCOUNTER — Telehealth: Payer: Self-pay

## 2023-12-29 NOTE — Telephone Encounter (Signed)
 Received fax from [facility] laboratory with H. Pylori results. Lab states H. Pylori is not detected. Brigitte Canard, PA has reviewed results. Patient informed of lab results and patient verbalized understanding.

## 2024-01-05 ENCOUNTER — Ambulatory Visit: Admitting: Physician Assistant

## 2024-01-06 ENCOUNTER — Encounter: Payer: Self-pay | Admitting: Physician Assistant

## 2024-01-06 NOTE — Progress Notes (Signed)
 Brigitte Canard, PA-C [address] [place], [place]  [address] Phone: [phone]   Primary Care Physician: Patient, No Pcp Per  Primary Gastroenterologist:  Brigitte Canard, PA-C / Alvester Johnson, MD   Chief Complaint: Follow-up epigastric pain, RUQ pain, IDA anemia, constipation       HPI:   Norma Bender is a 33 y.o. female returns for 1 month follow-up of epigastric pain, RUQ pain, microcytic anemia, and mild constipation.  1 month ago she was started on pantoprazole  40 Mg once daily.  Also started on MiraLAX 1 capful daily for constipation.    Current symptoms: Epigastric pain has improved on pantoprazole  40 Mg once daily.  She had 1 episode of upper abdominal pain yesterday, however no other episodes of pain in the past month.  She is not taking MiraLAX.  She tried taking iron pill and discontinued because it made her feel weird.  She denies visible rectal bleeding or black stools.  Denies right upper quadrant pain, nausea, or vomiting.  Admits to belching and occasional indigestion which has improved on pantoprazole .  12/2023: H. pylori stool test was negative.  11/2023: Celiac labs negative.  Hgb 11.1, MCV 75, low iron, normal B12 and folate.  Was started on iron tablet once daily.  12/02/2023 RUQ ultrasound: Cholelithiasis without cholecystitis.  Hepatic steatosis.  Common bile duct 3 mm.  No previous EGD, colonoscopy, or GI evaluation.   10/09/2023 labs: Negative urine pregnancy  Normal CMP and lipase.  CBC showed anemia with hemoglobin 10.2, hematocrit 33, MCV 78.    Normal LFTs.   She denies menorrhagia.  Has a cycle every 2 or 3 months.  Denies NSAID use.  Is not taking iron or B12.  Has morbid obesity with BMI 55.   Current Outpatient Medications  Medication Sig Dispense Refill   Na Sulfate-K Sulfate-Mg Sulfate concentrate (SUPREP) 17.5-3.13-1.6 GM/177ML SOLN Take 1 kit (354 mLs total) by mouth once for 1 dose. 354 mL 0   pantoprazole  (PROTONIX ) 40 MG tablet  Take 1 tablet (40 mg total) by mouth daily. 90 tablet 3   No current facility-administered medications for this visit.    Allergies as of 01/07/2024   (No Known Allergies)    Past Medical History:  Diagnosis Date   Cholelithiasis    Diabetes (HCC)    GERD (gastroesophageal reflux disease)    Hepatic steatosis    Iron deficiency anemia    Obesity     History reviewed. No pertinent surgical history.  Review of Systems:    All systems reviewed and negative except where noted in HPI.    Physical Exam:  BP 112/78 (BP Location: Right Arm, Patient Position: Sitting, Cuff Size: Large)   Pulse 97   Ht 5' 6 (1.676 m)   Wt (!) 342 lb 8 oz (155.4 kg)   BMI 55.28 kg/m  No LMP recorded. (Menstrual status: Irregular Periods).  General: Well-nourished, morbidly obese, in no acute distress.  Lungs: Clear to auscultation bilaterally. Non-labored. Heart: Regular rate and rhythm, no murmurs rubs or gallops.  Abdomen: Bowel sounds are normal; Abdomen is Soft; No hepatosplenomegaly, masses or hernias;  No Abdominal Tenderness; No guarding or rebound tenderness. Neuro: Alert and oriented x 3.  Grossly intact.  Psych: Alert and cooperative, normal mood and affect.  Imaging Studies: No results found.  Labs: CBC    Component Value Date/Time   WBC 10.5 12/01/2023 1501   RBC 4.61 12/01/2023 1501   HGB 11.1 (L)  12/01/2023 1501   HCT 34.8 (L) 12/01/2023 1501   PLT 361.0 12/01/2023 1501   MCV 75.5 (L) 12/01/2023 1501   MCH 23.9 (L) 10/09/2023 2210   MCHC 31.9 12/01/2023 1501   RDW 18.0 (H) 12/01/2023 1501   LYMPHSABS 2.1 12/01/2023 1501   MONOABS 0.8 12/01/2023 1501   EOSABS 0.1 12/01/2023 1501   BASOSABS 0.1 12/01/2023 1501    CMP     Component Value Date/Time   NA 138 10/09/2023 2210   K 3.9 10/09/2023 2210   CL 103 10/09/2023 2210   CO2 28 10/09/2023 2210   GLUCOSE 98 10/09/2023 2210   BUN 11 10/09/2023 2210   CREATININE 0.74 10/09/2023 2210   CALCIUM 8.8 (L) 10/09/2023  2210   PROT 6.6 10/09/2023 2210   ALBUMIN 4.1 10/09/2023 2210   AST 11 (L) 10/09/2023 2210   ALT 8 10/09/2023 2210   ALKPHOS 54 10/09/2023 2210   BILITOT 0.4 10/09/2023 2210   GFRNONAA >60 10/09/2023 2210       Assessment and Plan:   Norma Bender is a 33 y.o. y/o female returns for 1 month follow-up of:  1.  Upper abdominal pain: Epigastric - improved on PPI - Continue pantoprazole  40 Mg daily - Schedule EGD given her iron deficiency anemia. Scheduling EGD I discussed risks of EGD with patient to include risk of bleeding, perforation, and risk of sedation.  Patient expressed understanding and agrees to proceed with EGD.   2.  Iron deficiency anemia - Repeat labs: CBC, iron panel, ferritin - Not currently on Iron - Avoid NSAIDs - Schedule EGD / Colonoscopy in hospital (due to BMI greater than 50) I discussed risks of EGD and colonoscopy with patient to include risk of bleeding, perforation, and risk of sedation.  Patient expressed understanding and agrees to proceed with procedures.    3.  Mild constipation - Start OTC MiraLAX 1 capful daily. - Recommend High Fiber diet with fruits, vegetables, and whole grains. - Drink 64 ounces of Fluids Daily.   4.  Morbid obesity (BMI 55)  5.  Cholelithiasis without cholecystitis - Asymptomatic; no RUQ pain or tenderness.  No nausea or vomiting. - Recommend low-fat diet. - Follow-up if she becomes symptomatic.  6.  Hepatic steatosis, normal LFTs Recommend a low-fat diet, regular exercise, and weight loss. Patient education handout about fatty liver disease was given and discussed. Fibrosis 4 Score = .37  Fib-4 interpretation is not validated for people under 35 or over 69 years of age. However, scores under 2.0 are generally considered low risk.     Brigitte Canard, PA-C  Follow up based on EGD and colonoscopy results and GI symptoms.

## 2024-01-07 ENCOUNTER — Other Ambulatory Visit (INDEPENDENT_AMBULATORY_CARE_PROVIDER_SITE_OTHER)

## 2024-01-07 ENCOUNTER — Encounter: Payer: Self-pay | Admitting: Physician Assistant

## 2024-01-07 ENCOUNTER — Ambulatory Visit (INDEPENDENT_AMBULATORY_CARE_PROVIDER_SITE_OTHER): Admitting: Physician Assistant

## 2024-01-07 VITALS — BP 112/78 | HR 97 | Ht 66.0 in | Wt 342.5 lb

## 2024-01-07 DIAGNOSIS — D509 Iron deficiency anemia, unspecified: Secondary | ICD-10-CM

## 2024-01-07 DIAGNOSIS — K8020 Calculus of gallbladder without cholecystitis without obstruction: Secondary | ICD-10-CM

## 2024-01-07 DIAGNOSIS — Z6841 Body Mass Index (BMI) 40.0 and over, adult: Secondary | ICD-10-CM

## 2024-01-07 DIAGNOSIS — K760 Fatty (change of) liver, not elsewhere classified: Secondary | ICD-10-CM

## 2024-01-07 DIAGNOSIS — R1013 Epigastric pain: Secondary | ICD-10-CM | POA: Diagnosis not present

## 2024-01-07 DIAGNOSIS — K5900 Constipation, unspecified: Secondary | ICD-10-CM

## 2024-01-07 LAB — IBC + FERRITIN
Ferritin: 13.2 ng/mL (ref 10.0–291.0)
Iron: 39 ug/dL — ABNORMAL LOW (ref 42–145)
Saturation Ratios: 10 % — ABNORMAL LOW (ref 20.0–50.0)
TIBC: 390.6 ug/dL (ref 250.0–450.0)
Transferrin: 279 mg/dL (ref 212.0–360.0)

## 2024-01-07 LAB — CBC
HCT: 32.8 % — ABNORMAL LOW (ref 36.0–46.0)
Hemoglobin: 10.5 g/dL — ABNORMAL LOW (ref 12.0–15.0)
MCHC: 32 g/dL (ref 30.0–36.0)
MCV: 74.4 fl — ABNORMAL LOW (ref 78.0–100.0)
Platelets: 336 10*3/uL (ref 150.0–400.0)
RBC: 4.41 Mil/uL (ref 3.87–5.11)
RDW: 17.9 % — ABNORMAL HIGH (ref 11.5–15.5)
WBC: 8.5 10*3/uL (ref 4.0–10.5)

## 2024-01-07 MED ORDER — NA SULFATE-K SULFATE-MG SULF 17.5-3.13-1.6 GM/177ML PO SOLN
1.0000 | Freq: Once | ORAL | 0 refills | Status: AC
Start: 2024-01-07 — End: 2024-01-07

## 2024-01-07 NOTE — Patient Instructions (Signed)
 Your provider has requested that you go to the basement level for lab work before leaving today. Press B on the elevator. The lab is located at the first door on the left as you exit the elevator.  You have been scheduled for an endoscopy and colonoscopy. Please follow the written instructions given to you at your visit today.  If you use inhalers (even only as needed), please bring them with you on the day of your procedure.  DO NOT TAKE 7 DAYS PRIOR TO TEST- Trulicity (dulaglutide) Ozempic, Wegovy (semaglutide) Mounjaro (tirzepatide) Bydureon Bcise (exanatide extended release)  DO NOT TAKE 1 DAY PRIOR TO YOUR TEST Rybelsus (semaglutide) Adlyxin (lixisenatide) Victoza (liraglutide) Byetta (exanatide) ___________________________________________________________________________ _______________________________________________________  If your blood pressure at your visit was 140/90 or greater, please contact your primary care physician to follow up on this.  _______________________________________________________  If you are age 50 or older, your body mass index should be between 23-30. Your Body mass index is 55.28 kg/m. If this is out of the aforementioned range listed, please consider follow up with your Primary Care Provider.  If you are age 34 or younger, your body mass index should be between 19-25. Your Body mass index is 55.28 kg/m. If this is out of the aformentioned range listed, please consider follow up with your Primary Care Provider.   ________________________________________________________  The [facility] providers would like to encourage you to use MYCHART to communicate with providers for non-urgent requests or questions.  Due to long hold times on the telephone, sending your provider a message by [facility] may be a faster and more efficient way to get a response.  Please allow 48 business hours for a response.  Please remember that this is for non-urgent requests.   _______________________________________________________

## 2024-01-07 NOTE — Progress Notes (Signed)
 Agree with assessment and plan as outlined.

## 2024-01-10 ENCOUNTER — Ambulatory Visit: Payer: Self-pay | Admitting: Physician Assistant

## 2024-02-10 ENCOUNTER — Telehealth: Payer: Self-pay | Admitting: Gastroenterology

## 2024-02-10 NOTE — Telephone Encounter (Signed)
 Procedure:Colonoscopy/Endoscopy Procedure date: 02/17/24 Procedure location: [facility] Arrival Time: 9:30 am Spoke with the patient Y/N:   No, I left a detailed message on [phone] on 02/10/24 @ 4:22 pm for the patient to return call   No, I left a detailed message on [phone] on 02/11/24 @ 11:33 am for the patient to return call, Mychart message sent.     Any prep concerns? ___  Has the patient obtained the prep from the pharmacy ? ___ Do you have a care partner and transportation: ___ Any additional concerns? ___

## 2024-02-11 NOTE — Progress Notes (Signed)
 Attempted to obtain medical history for pre op call via telephone, unable to reach at this time. HIPAA compliant voicemail message left requesting return call to pre surgical testing department.

## 2024-02-15 NOTE — Telephone Encounter (Signed)
 Spoke with pt and she is aware of procedure, has her prep, transportation arranged and has no questions.

## 2024-02-17 ENCOUNTER — Encounter (HOSPITAL_COMMUNITY): Payer: Self-pay | Admitting: Gastroenterology

## 2024-02-17 ENCOUNTER — Encounter (HOSPITAL_COMMUNITY)
Admission: RE | Disposition: A | Discharge status: Home / Self Care | Payer: Self-pay | Source: Home / Self Care | Attending: Gastroenterology

## 2024-02-17 ENCOUNTER — Other Ambulatory Visit: Payer: Self-pay

## 2024-02-17 ENCOUNTER — Ambulatory Visit (HOSPITAL_COMMUNITY): Admitting: Certified Registered Nurse Anesthetist

## 2024-02-17 ENCOUNTER — Ambulatory Visit (HOSPITAL_COMMUNITY)
Admission: RE | Admit: 2024-02-17 | Discharge: 2024-02-17 | Disposition: A | Discharge status: Home / Self Care | Attending: Gastroenterology | Admitting: Gastroenterology

## 2024-02-17 DIAGNOSIS — D509 Iron deficiency anemia, unspecified: Secondary | ICD-10-CM | POA: Diagnosis not present

## 2024-02-17 DIAGNOSIS — K6389 Other specified diseases of intestine: Secondary | ICD-10-CM

## 2024-02-17 DIAGNOSIS — D122 Benign neoplasm of ascending colon: Secondary | ICD-10-CM | POA: Diagnosis not present

## 2024-02-17 DIAGNOSIS — K562 Volvulus: Secondary | ICD-10-CM | POA: Diagnosis not present

## 2024-02-17 DIAGNOSIS — K5900 Constipation, unspecified: Secondary | ICD-10-CM

## 2024-02-17 DIAGNOSIS — K648 Other hemorrhoids: Secondary | ICD-10-CM | POA: Insufficient documentation

## 2024-02-17 DIAGNOSIS — K8020 Calculus of gallbladder without cholecystitis without obstruction: Secondary | ICD-10-CM | POA: Insufficient documentation

## 2024-02-17 DIAGNOSIS — J385 Laryngeal spasm: Secondary | ICD-10-CM | POA: Insufficient documentation

## 2024-02-17 DIAGNOSIS — Z79899 Other long term (current) drug therapy: Secondary | ICD-10-CM | POA: Diagnosis not present

## 2024-02-17 DIAGNOSIS — E119 Type 2 diabetes mellitus without complications: Secondary | ICD-10-CM

## 2024-02-17 DIAGNOSIS — K219 Gastro-esophageal reflux disease without esophagitis: Secondary | ICD-10-CM | POA: Insufficient documentation

## 2024-02-17 DIAGNOSIS — K317 Polyp of stomach and duodenum: Secondary | ICD-10-CM | POA: Diagnosis not present

## 2024-02-17 DIAGNOSIS — Q438 Other specified congenital malformations of intestine: Secondary | ICD-10-CM | POA: Insufficient documentation

## 2024-02-17 DIAGNOSIS — D649 Anemia, unspecified: Secondary | ICD-10-CM | POA: Diagnosis not present

## 2024-02-17 DIAGNOSIS — R1013 Epigastric pain: Secondary | ICD-10-CM | POA: Diagnosis present

## 2024-02-17 DIAGNOSIS — K3189 Other diseases of stomach and duodenum: Secondary | ICD-10-CM | POA: Insufficient documentation

## 2024-02-17 DIAGNOSIS — D126 Benign neoplasm of colon, unspecified: Secondary | ICD-10-CM

## 2024-02-17 DIAGNOSIS — D131 Benign neoplasm of stomach: Secondary | ICD-10-CM

## 2024-02-17 HISTORY — PX: ESOPHAGOGASTRODUODENOSCOPY: SHX5428

## 2024-02-17 HISTORY — PX: COLONOSCOPY: SHX5424

## 2024-02-17 LAB — PREGNANCY, URINE: Preg Test, Ur: NEGATIVE

## 2024-02-17 SURGERY — COLONOSCOPY
Anesthesia: Monitor Anesthesia Care

## 2024-02-17 MED ORDER — MIDAZOLAM HCL 2 MG/2ML IJ SOLN
INTRAMUSCULAR | Status: AC
  Filled 2024-02-17: qty 2

## 2024-02-17 MED ORDER — PROPOFOL 500 MG/50ML IV EMUL
INTRAVENOUS | Status: DC | PRN
Start: 2024-02-17 — End: 2024-02-17
  Administered 2024-02-17: 125 ug/kg/min via INTRAVENOUS

## 2024-02-17 MED ORDER — ROCURONIUM BROMIDE 100 MG/10ML IV SOLN
INTRAVENOUS | Status: DC | PRN
  Administered 2024-02-17: 40 mg via INTRAVENOUS

## 2024-02-17 MED ORDER — PROPOFOL 500 MG/50ML IV EMUL
INTRAVENOUS | Status: AC
  Filled 2024-02-17: qty 50

## 2024-02-17 MED ORDER — PROPOFOL 1000 MG/100ML IV EMUL
INTRAVENOUS | Status: AC
  Filled 2024-02-17: qty 100

## 2024-02-17 MED ORDER — PHENYLEPHRINE 80 MCG/ML (10ML) SYRINGE FOR IV PUSH (FOR BLOOD PRESSURE SUPPORT)
PREFILLED_SYRINGE | INTRAVENOUS | Status: DC | PRN
  Administered 2024-02-17: 160 ug via INTRAVENOUS

## 2024-02-17 MED ORDER — PROPOFOL 1000 MG/100ML IV EMUL
INTRAVENOUS | Status: AC
Start: 2024-02-17 — End: 2024-02-17
  Filled 2024-02-17: qty 100

## 2024-02-17 MED ORDER — SUGAMMADEX SODIUM 500 MG/5ML IV SOLN
INTRAVENOUS | Status: DC | PRN
  Administered 2024-02-17: 400 mg via INTRAVENOUS

## 2024-02-17 MED ORDER — SUCCINYLCHOLINE CHLORIDE 200 MG/10ML IV SOSY
PREFILLED_SYRINGE | INTRAVENOUS | Status: DC | PRN
Start: 2024-02-17 — End: 2024-02-17
  Administered 2024-02-17: 180 mg via INTRAVENOUS

## 2024-02-17 MED ORDER — MIDAZOLAM HCL 2 MG/2ML IJ SOLN
INTRAMUSCULAR | Status: DC | PRN
  Administered 2024-02-17: 2 mg via INTRAVENOUS

## 2024-02-17 MED ORDER — PROPOFOL 10 MG/ML IV BOLUS
INTRAVENOUS | Status: DC | PRN
  Administered 2024-02-17: 30 mg via INTRAVENOUS

## 2024-02-17 MED ORDER — SODIUM CHLORIDE 0.9 % IV SOLN: INTRAVENOUS | Status: DC

## 2024-02-17 MED ORDER — LIDOCAINE 2% (20 MG/ML) 5 ML SYRINGE
INTRAMUSCULAR | Status: DC | PRN
  Administered 2024-02-17: 60 mg via INTRAVENOUS

## 2024-02-17 NOTE — Anesthesia Postprocedure Evaluation (Signed)
 Anesthesia Post Note  Patient: Norma Bender  Procedure(s) Performed: COLONOSCOPY EGD (ESOPHAGOGASTRODUODENOSCOPY)     Patient location during evaluation: PACU Anesthesia Type: General Level of consciousness: awake and alert Pain management: pain level controlled Vital Signs Assessment: post-procedure vital signs reviewed and stable Respiratory status: spontaneous breathing, nonlabored ventilation, respiratory function stable and patient connected to nasal cannula oxygen Cardiovascular status: blood pressure returned to baseline and stable Postop Assessment: no apparent nausea or vomiting Anesthetic complications: no   No notable events documented.  Last Vitals:  Vitals:   02/17/24 1050 02/17/24 1100  BP: (!) 144/81 (!) 125/59  Pulse: 99 88  Resp: 20 17  Temp:    SpO2: 100% 100%    Last Pain:  Vitals:   02/17/24 1100  TempSrc:   PainSc: 0-No pain                 Cordella P Candance Bohlman

## 2024-02-17 NOTE — Op Note (Signed)
 [facility] Patient Name: Norma Bender Procedure Date: 02/17/2024 MRN: [id number] Attending MD: Norma SQUIBB. Leigh , MD, [id number] Date of Birth: [birth year] CSN: [id number] Age: 33 Admit Type: Outpatient Procedure:                Colonoscopy Indications:              Iron deficiency anemia Providers:                Norma P. Leigh, MD, Ozell Pouch, Corene Southgate, Technician Referring MD:              Medicines:                Monitored Anesthesia Care Complications:            No immediate complications. Estimated blood loss:                            Minimal. Estimated Blood Loss:     Estimated blood loss was minimal. Procedure:                Pre-Anesthesia Assessment:                           - Prior to the procedure, a History and Physical                            was performed, and patient medications and                            allergies were reviewed. The patient's tolerance of                            previous anesthesia was also reviewed. The risks                            and benefits of the procedure and the sedation                            options and risks were discussed with the patient.                            All questions were answered, and informed consent                            was obtained. Prior Anticoagulants: The patient has                            taken no anticoagulant or antiplatelet agents. ASA                            Grade Assessment: III - A patient with severe                            systemic disease.  After reviewing the risks and                            benefits, the patient was deemed in satisfactory                            condition to undergo the procedure.                           After obtaining informed consent, the colonoscope                            was passed under direct vision. Throughout the                            procedure, the patient's blood  pressure, pulse, and                            oxygen saturations were monitored continuously. The                            CF-HQ190L ([id number] Olympus colonoscope was                            introduced through the anus and advanced to the the                            cecum, identified by appendiceal orifice and                            ileocecal valve. The colonoscopy was performed                            without difficulty. The patient tolerated the                            procedure well. The quality of the bowel                            preparation was good. The ileocecal valve,                            appendiceal orifice, and rectum were photographed. Scope In: 10:17:11 AM Scope Out: 10:29:53 AM Scope Withdrawal Time: 0 hours 9 minutes 10 seconds  Total Procedure Duration: 0 hours 12 minutes 42 seconds  Findings:      The perianal and digital rectal examinations were normal.      A 3 to 4 mm polyp was found in the ascending colon. The polyp was       sessile. The polyp was removed with a cold snare. Resection and       retrieval were complete.      The ascending colon revealed excessive looping. Ileal intubation not       performed in this light.      Internal hemorrhoids were found during retroflexion.  The exam was otherwise without abnormality. Impression:               - One 3 to 4 mm polyp in the ascending colon,                            removed with a cold snare. Resected and retrieved.                           - There was significant looping of the right colon.                           - Internal hemorrhoids.                           - The examination was otherwise normal.                           No cause for iron deficiency on colonoscopy. Will                            await EGD pathology results, but may be more likely                            menstrual blood loss has led to iron deficiency. Moderate Sedation:      No moderate sedation,  case performed with MAC Recommendation:           - Patient has a contact number available for                            emergencies. The signs and symptoms of potential                            delayed complications were discussed with the                            patient. Return to normal activities tomorrow.                            Written discharge instructions were provided to the                            patient.                           - Resume previous diet.                           - Await pathology results.                           - Continue present medications.                           - Trend Hgb on iron supplementation Procedure Code(s):        --- Professional ---  54614, Colonoscopy, flexible; with removal of                            tumor(s), polyp(s), or other lesion(s) by snare                            technique Diagnosis Code(s):        --- Professional ---                           D12.2, Benign neoplasm of ascending colon                           K64.8, Other hemorrhoids                           D50.9, Iron deficiency anemia, unspecified CPT copyright 2022 American Medical Association. All rights reserved. The codes documented in this report are preliminary and upon coder review may  be revised to meet current compliance requirements. Norma P. Benen Weida, MD 02/17/2024 10:36:07 AM This report has been signed electronically. Number of Addenda: 0

## 2024-02-17 NOTE — Transfer of Care (Signed)
 Immediate Anesthesia Transfer of Care Note  Patient: Norma Bender  Procedure(s) Performed: COLONOSCOPY EGD (ESOPHAGOGASTRODUODENOSCOPY)  Patient Location: Endoscopy Unit  Anesthesia Type:General  Level of Consciousness: awake and patient cooperative  Airway & Oxygen Therapy: Patient Spontanous Breathing and Patient connected to face mask  Post-op Assessment: Report given to RN and Post -op Vital signs reviewed and stable  Post vital signs: Reviewed and stable  Last Vitals:  Vitals Value Taken Time  BP 141/67 02/17/24 10:43  Temp    Pulse 99 02/17/24 10:45  Resp 20 02/17/24 10:45  SpO2 100 % 02/17/24 10:45  Vitals shown include unfiled device data.  Last Pain:  Vitals:   02/17/24 0824  TempSrc: Temporal  PainSc: 0-No pain      Patients Stated Pain Goal: 0 (02/17/24 0824)  Complications: No notable events documented.

## 2024-02-17 NOTE — Anesthesia Procedure Notes (Signed)
 Procedure Name: Intubation Date/Time: 02/17/2024 10:17 AM  Performed by: Judythe Tanda Aran, CRNAPre-anesthesia Checklist: Emergency Drugs available, Suction available, Patient identified and Patient being monitored Patient Re-evaluated:Patient Re-evaluated prior to induction Oxygen Delivery Method: Circle system utilized Preoxygenation: Pre-oxygenation with 100% oxygen Induction Type: IV induction Ventilation: Two handed mask ventilation required and Oral airway inserted - appropriate to patient size Laryngoscope Size: Glidescope Grade View: Grade I Tube type: Oral Tube size: 7.0 mm Number of attempts: 1 Airway Equipment and Method: Video-laryngoscopy Placement Confirmation: ETT inserted through vocal cords under direct vision, positive ETCO2 and breath sounds checked- equal and bilateral Secured at: 22 cm Tube secured with: Tape Dental Injury: Teeth and Oropharynx as per pre-operative assessment  Difficulty Due To: Difficulty was anticipated and Difficult Airway- due to reduced neck mobility Comments: Unable to maintain adequete airway due to large amount of secretions, pt position and body habitus.   Dr Avon in and pt two hand bagged with difficulty, intubated with glidescope to enable procure to continue.

## 2024-02-17 NOTE — Anesthesia Preprocedure Evaluation (Addendum)
 Anesthesia Evaluation  Patient identified by MRN, date of birth, ID band Patient awake    Reviewed: Allergy & Precautions, NPO status , Patient's Chart, lab work & pertinent test results  Airway Mallampati: III  TM Distance: >3 FB Neck ROM: Full    Dental no notable dental hx.    Pulmonary neg pulmonary ROS   Pulmonary exam normal        Cardiovascular negative cardio ROS  Rhythm:Regular Rate:Normal     Neuro/Psych negative neurological ROS  negative psych ROS   GI/Hepatic Neg liver ROS,GERD  ,,  Endo/Other  diabetes    Renal/GU   negative genitourinary   Musculoskeletal   Abdominal Normal abdominal exam  (+)   Peds  Hematology  (+) Blood dyscrasia, anemia   Anesthesia Other Findings   Reproductive/Obstetrics                              Anesthesia Physical Anesthesia Plan  ASA: 3  Anesthesia Plan: MAC   Post-op Pain Management:    Induction: Intravenous  PONV Risk Score and Plan: 2 and Propofol  infusion and Treatment may vary due to age or medical condition  Airway Management Planned: Simple Face Mask and Nasal Cannula  Additional Equipment: None  Intra-op Plan:   Post-operative Plan:   Informed Consent: I have reviewed the patients History and Physical, chart, labs and discussed the procedure including the risks, benefits and alternatives for the proposed anesthesia with the patient or authorized representative who has indicated his/her understanding and acceptance.     Dental advisory given  Plan Discussed with: CRNA  Anesthesia Plan Comments:          Anesthesia Quick Evaluation

## 2024-02-17 NOTE — Discharge Instructions (Signed)

## 2024-02-17 NOTE — Anesthesia Procedure Notes (Signed)
 Procedure Name: MAC Date/Time: 02/17/2024 9:46 AM  Performed by: Judythe Tanda Aran, CRNAPre-anesthesia Checklist: Patient identified, Emergency Drugs available, Suction available and Patient being monitored Patient Re-evaluated:Patient Re-evaluated prior to induction Oxygen Delivery Method: Simple face mask

## 2024-02-17 NOTE — H&P (Signed)
 [facility] History and Physical   Primary Care Physician:  Patient, No Pcp Per   Reason for Procedure:   Iron deficiency anemia, abdominal pain  Plan:    EGD and colonoscopy     HPI: Norma Bender is a 33 y.o. female  here for EGD and colonoscopy to evaluate abdominal pain, iron deficiency anemia. She has been having intermittent upper abdominal pain. Started on protonix  40mg  / day which has helped. She reports stable menses that aren't heavy. Noted to have IDA. No overt GI blood loss. RUQ US  did show gallstones. EGD and colonoscopy to further evaluate. Improved from last office visit. Case done at the hospital for anesthesia support given BMI > 50.  Otherwise feels well without any cardiopulmonary symptoms.   I have discussed risks / benefits of anesthesia and endoscopic procedure with Harlene CHRISTELLA Molt and they wish to proceed with the exams as outlined today.    Past Medical History:  Diagnosis Date   Cholelithiasis    Diabetes (HCC)    GERD (gastroesophageal reflux disease)    Hepatic steatosis    Iron deficiency anemia    Obesity     History reviewed. No pertinent surgical history.  Prior to Admission medications   Medication Sig Start Date End Date Taking? Authorizing Provider  Barberry-Oreg Grape-Goldenseal (BERBERINE COMPLEX PO) Take by mouth.   Yes [provider]  ferrous sulfate 324 MG TBEC Take 324 mg by mouth daily.   Yes [provider]  pantoprazole  (PROTONIX ) 40 MG tablet Take 1 tablet (40 mg total) by mouth daily. 12/01/23  Yes [place], PA-C    Current Facility-Administered Medications  Medication Dose Route Frequency Provider Last Rate Last Admin   0.9 %  sodium chloride  infusion   Intravenous Continuous [place], PA-C 20 mL/hr at 02/17/24 0826 New Bag at 02/17/24 0826    Allergies as of 01/07/2024   (No Known Allergies)    History reviewed. No pertinent family history.  Social History   Socioeconomic History    Marital status: Single    Spouse name: Not on file   Number of children: Not on file   Years of education: Not on file   Highest education level: Not on file  Occupational History   Not on file  Tobacco Use   Smoking status: Never   Smokeless tobacco: Never  Substance and Sexual Activity   Alcohol use: Never   Drug use: Never   Sexual activity: Not Currently  Other Topics Concern   Not on file  Social History Narrative   Not on file   Social Drivers of Health   Financial Resource Strain: Not on file  Food Insecurity: Not on file  Transportation Needs: Not on file  Physical Activity: Not on file  Stress: Not on file  Social Connections: Not on file  Intimate Partner Violence: Not on file    Review of Systems: All other review of systems negative except as mentioned in the HPI.  Physical Exam: Vital signs BP (!) 140/85   Temp (!) 96.8 F (36 C) (Temporal)   Resp 18   Ht 5' 6 (1.676 m)   Wt (!) 154.2 kg   LMP 02/15/2024   SpO2 97%   BMI 54.88 kg/m   General:   Alert,  Well-developed, pleasant and cooperative in NAD Lungs:  Clear throughout to auscultation.   Heart:  Regular rate and rhythm Abdomen:  Soft, nontender and nondistended.   Neuro/Psych:  Alert and cooperative. Normal  mood and affect. A and O x 3  Marcey Naval, MD [facility]

## 2024-02-17 NOTE — Op Note (Addendum)
 [facility] Patient Name: Norma Bender Procedure Date: 02/17/2024 MRN: [id number] Attending MD: Elspeth SQUIBB. Leigh , MD, [id number] Date of Birth: 14-Sep-1990 CSN: [id number] Age: 33 Admit Type: Outpatient Procedure:                Upper GI endoscopy Indications:              Epigastric abdominal pain, Iron deficiency anemia -                            improved on protonix . Also has gallstones on RUQ US  Providers:                Elspeth P. Leigh, MD, Ozell Pouch, Corene Southgate, Technician Referring MD:              Medicines:                Monitored Anesthesia Care Complications:            No immediate complications. Estimated blood loss:                            Minimal. Patient had a transient oxygen                            desaturation / laryngospasm after the completion of                            the EGD, managed per anesthesia. They ended up                            electively intubating her to be able to safely                            complete the colonoscopy. She obstructed easily                            with sedation and had excessive secretions, which                            led to laryngospasm. Managed well per anesthesia                            team Estimated Blood Loss:     Estimated blood loss was minimal. Procedure:                Pre-Anesthesia Assessment:                           - Prior to the procedure, a History and Physical                            was performed, and patient medications and  allergies were reviewed. The patient's tolerance of                            previous anesthesia was also reviewed. The risks                            and benefits of the procedure and the sedation                            options and risks were discussed with the patient.                            All questions were answered, and informed consent                             was obtained. Prior Anticoagulants: The patient has                            taken no anticoagulant or antiplatelet agents. ASA                            Grade Assessment: III - A patient with severe                            systemic disease. After reviewing the risks and                            benefits, the patient was deemed in satisfactory                            condition to undergo the procedure.                           After obtaining informed consent, the endoscope was                            passed under direct vision. Throughout the                            procedure, the patient's blood pressure, pulse, and                            oxygen saturations were monitored continuously. The                            [id number]-[id number] ([id number]) Olympus endoscope was introduced                            through the mouth, and advanced to the second part                            of duodenum. The upper GI endoscopy was  accomplished without difficulty. The patient                            tolerated the procedure. Scope In: Scope Out: Findings:      Esophagogastric landmarks were identified: the Z-line was found at 38       cm, the gastroesophageal junction was found at 38 cm and the upper       extent of the gastric folds was found at 38 cm from the incisors.      The exam of the esophagus was otherwise normal.      A few small sessile polyps were found in the gastric body. A       representative polyp was removed with a cold biopsy forceps. Resection       and retrieval were complete.      The exam of the stomach was otherwise normal.      Biopsies were taken with a cold forceps for Helicobacter pylori testing.      A single 2 to 3 mm sessile polyp was found in the second portion of the       duodenum. The polyp was removed with a cold biopsy forceps. Resection       and retrieval were complete.      The exam of the duodenum was  otherwise normal. Impression:               - Esophagogastric landmarks identified.                           - Normal esophagus otherwise.                           - A few gastric polyps. Suspect benign fundic gland                            polyps. Representative sample resected and                            retrieved.                           - Normal stomach otherwise - biopsies taken to rule                            out H pylori.                           - A single duodenal polyp. Resected and retrieved.                           - Normal duodenum otherwise.                           Overall no concerning pathology noted on this exam                            to account for her pain or anemia. While improved  on protonix , biliary colic from gallstones also on                            the ddx. If symptoms recur / persist, consideration                            for general surgery evaluation for possible                            cholecystectomy. Moderate Sedation:      No moderate sedation, case performed with MAC Recommendation:           - Patient has a contact number available for                            emergencies. The signs and symptoms of potential                            delayed complications were discussed with the                            patient. Return to normal activities tomorrow.                            Written discharge instructions were provided to the                            patient.                           - Resume previous diet.                           - Continue present medications.                           - Await pathology results. Procedure Code(s):        --- Professional ---                           [id number], Esophagogastroduodenoscopy, flexible,                            transoral; with biopsy, single or multiple Diagnosis Code(s):        --- Professional ---                           K31.7, Polyp  of stomach and duodenum                           R10.13, Epigastric pain                           D50.9, Iron deficiency anemia, unspecified CPT copyright 2022 American Medical Association. All rights reserved. The codes documented in this report are preliminary and upon coder review may  be revised to meet current compliance requirements. Elspeth P.  Landan Fedie, MD 02/17/2024 10:15:50 AM This report has been signed electronically. Number of Addenda: 0

## 2024-02-18 ENCOUNTER — Ambulatory Visit: Payer: Self-pay | Admitting: Gastroenterology

## 2024-02-18 DIAGNOSIS — D509 Iron deficiency anemia, unspecified: Secondary | ICD-10-CM

## 2024-02-18 LAB — SURGICAL PATHOLOGY

## 2024-02-20 ENCOUNTER — Encounter (HOSPITAL_COMMUNITY): Payer: Self-pay | Admitting: Gastroenterology

## 2024-02-21 ENCOUNTER — Other Ambulatory Visit (INDEPENDENT_AMBULATORY_CARE_PROVIDER_SITE_OTHER)

## 2024-02-21 DIAGNOSIS — D509 Iron deficiency anemia, unspecified: Secondary | ICD-10-CM | POA: Diagnosis not present

## 2024-02-21 LAB — CBC WITH DIFFERENTIAL/PLATELET
Basophils Absolute: 0.1 K/uL (ref 0.0–0.1)
Basophils Relative: 0.6 % (ref 0.0–3.0)
Eosinophils Absolute: 0.1 K/uL (ref 0.0–0.7)
Eosinophils Relative: 0.9 % (ref 0.0–5.0)
HCT: 31.9 % — ABNORMAL LOW (ref 36.0–46.0)
Hemoglobin: 10.4 g/dL — ABNORMAL LOW (ref 12.0–15.0)
Lymphocytes Relative: 23.5 % (ref 12.0–46.0)
Lymphs Abs: 2.1 K/uL (ref 0.7–4.0)
MCHC: 32.6 g/dL (ref 30.0–36.0)
MCV: 75.8 fl — ABNORMAL LOW (ref 78.0–100.0)
Monocytes Absolute: 0.4 K/uL (ref 0.1–1.0)
Monocytes Relative: 4.7 % (ref 3.0–12.0)
Neutro Abs: 6.2 K/uL (ref 1.4–7.7)
Neutrophils Relative %: 70.3 % (ref 43.0–77.0)
Platelets: 350 K/uL (ref 150.0–400.0)
RBC: 4.21 Mil/uL (ref 3.87–5.11)
RDW: 18.7 % — ABNORMAL HIGH (ref 11.5–15.5)
WBC: 8.8 K/uL (ref 4.0–10.5)

## 2024-02-22 ENCOUNTER — Ambulatory Visit: Payer: Self-pay | Admitting: Gastroenterology

## 2024-02-22 LAB — IBC + FERRITIN
Ferritin: 24.3 ng/mL (ref 10.0–291.0)
Iron: 32 ug/dL — ABNORMAL LOW (ref 42–145)
Saturation Ratios: 9.7 % — ABNORMAL LOW (ref 20.0–50.0)
TIBC: 330.4 ug/dL (ref 250.0–450.0)
Transferrin: 236 mg/dL (ref 212.0–360.0)

## 2024-02-28 ENCOUNTER — Other Ambulatory Visit: Payer: Self-pay

## 2024-02-28 DIAGNOSIS — R1013 Epigastric pain: Secondary | ICD-10-CM

## 2024-02-29 ENCOUNTER — Other Ambulatory Visit (INDEPENDENT_AMBULATORY_CARE_PROVIDER_SITE_OTHER)

## 2024-02-29 ENCOUNTER — Ambulatory Visit: Payer: Self-pay | Admitting: Physician Assistant

## 2024-02-29 DIAGNOSIS — R1013 Epigastric pain: Secondary | ICD-10-CM | POA: Diagnosis not present

## 2024-02-29 LAB — COMPREHENSIVE METABOLIC PANEL WITH GFR
ALT: 15 U/L (ref 0–35)
AST: 14 U/L (ref 0–37)
Albumin: 3.9 g/dL (ref 3.5–5.2)
Alkaline Phosphatase: 60 U/L (ref 39–117)
BUN: 8 mg/dL (ref 6–23)
CO2: 27 meq/L (ref 19–32)
Calcium: 8.9 mg/dL (ref 8.4–10.5)
Chloride: 103 meq/L (ref 96–112)
Creatinine, Ser: 0.74 mg/dL (ref 0.40–1.20)
GFR: 105.7 mL/min (ref >60.00)
Glucose, Bld: 105 mg/dL — ABNORMAL HIGH (ref 70–99)
Potassium: 3.6 meq/L (ref 3.5–5.1)
Sodium: 138 meq/L (ref 135–145)
Total Bilirubin: 0.4 mg/dL (ref 0.2–1.2)
Total Protein: 7.4 g/dL (ref 6.0–8.3)

## 2024-02-29 LAB — CBC WITH DIFFERENTIAL/PLATELET
Basophils Absolute: 0 K/uL (ref 0.0–0.1)
Basophils Relative: 0.4 % (ref 0.0–3.0)
Eosinophils Absolute: 0 K/uL (ref 0.0–0.7)
Eosinophils Relative: 0.5 % (ref 0.0–5.0)
HCT: 33.4 % — ABNORMAL LOW (ref 36.0–46.0)
Hemoglobin: 10.6 g/dL — ABNORMAL LOW (ref 12.0–15.0)
Lymphocytes Relative: 20.7 % (ref 12.0–46.0)
Lymphs Abs: 2.1 K/uL (ref 0.7–4.0)
MCHC: 31.8 g/dL (ref 30.0–36.0)
MCV: 75.7 fl — ABNORMAL LOW (ref 78.0–100.0)
Monocytes Absolute: 0.6 K/uL (ref 0.1–1.0)
Monocytes Relative: 5.8 % (ref 3.0–12.0)
Neutro Abs: 7.4 K/uL (ref 1.4–7.7)
Neutrophils Relative %: 72.6 % (ref 43.0–77.0)
Platelets: 427 K/uL — ABNORMAL HIGH (ref 150.0–400.0)
RBC: 4.41 Mil/uL (ref 3.87–5.11)
RDW: 18 % — ABNORMAL HIGH (ref 11.5–15.5)
WBC: 10.2 K/uL (ref 4.0–10.5)

## 2024-02-29 LAB — LIPASE: Lipase: 11 U/L (ref 11.0–59.0)

## 2024-03-01 ENCOUNTER — Telehealth: Payer: Self-pay

## 2024-03-01 NOTE — Telephone Encounter (Signed)
 Referral was faxed to [facility] this am for possible gallbladder surgery. Pt knows they will contact her regarding appt.

## 2024-03-06 ENCOUNTER — Ambulatory Visit: Payer: Self-pay | Admitting: Surgery

## 2024-03-28 ENCOUNTER — Encounter (HOSPITAL_COMMUNITY): Payer: Self-pay | Admitting: Surgery

## 2024-03-28 ENCOUNTER — Other Ambulatory Visit: Payer: Self-pay

## 2024-03-28 NOTE — Progress Notes (Signed)
 PCP - none Cardiologist - none Gastro - Ellouise Console, PA-C  Chest x-ray - n/a EKG - 10/09/23 Stress Test - n/a ECHO - n./a Cardiac Cath - n/a  ICD Pacemaker/Loop - n/a  Sleep Study -  none   Diabetes Type 2, no meds, diet controlled, does not check blood sugar.  Aspirin & Blood Thinner Instructions:  n/a  ERAS - clear liquids til 7 AM DOS  Anesthesia review: Yes  STOP now taking any Aspirin (unless otherwise instructed by your surgeon), Aleve, Naproxen, Ibuprofen , Motrin , Advil , Goody's, BC's, all herbal medications, fish oil, and all vitamins.   Coronavirus Screening Do you have any of the following symptoms:  Cough yes/no: No Fever (>100.11F)  yes/no: No Runny nose yes/no: No Sore throat yes/no: No Difficulty breathing/shortness of breath  yes/no: No  Have you traveled in the last 14 days and where? yes/no: No  Patient verbalized understanding of instructions that were given via phone.

## 2024-03-28 NOTE — Anesthesia Preprocedure Evaluation (Signed)
 Anesthesia Evaluation  Patient identified by MRN, date of birth, ID band Patient awake    Reviewed: Allergy & Precautions, H&P , NPO status , Patient's Chart, lab work & pertinent test results  History of Anesthesia Complications Negative for: history of anesthetic complications  Airway Mallampati: III  TM Distance: >3 FB Neck ROM: Full    Dental no notable dental hx.    Pulmonary neg pulmonary ROS   Pulmonary exam normal breath sounds clear to auscultation       Cardiovascular negative cardio ROS Normal cardiovascular exam Rhythm:Regular Rate:Normal     Neuro/Psych negative neurological ROS  negative psych ROS   GI/Hepatic Neg liver ROS,GERD  ,,chronic cholecystitis    Endo/Other  diabetes  Class 4 obesity  Renal/GU negative Renal ROS  negative genitourinary   Musculoskeletal negative musculoskeletal ROS (+)    Abdominal   Peds negative pediatric ROS (+)  Hematology negative hematology ROS (+)   Anesthesia Other Findings   Reproductive/Obstetrics negative OB ROS                              Anesthesia Physical Anesthesia Plan  ASA: 3  Anesthesia Plan: General   Post-op Pain Management: Tylenol  PO (pre-op)*   Induction:   PONV Risk Score and Plan: 3 and Ondansetron , Dexamethasone , Midazolam  and Treatment may vary due to age or medical condition  Airway Management Planned: Oral ETT and Video Laryngoscope Planned  Additional Equipment: None  Intra-op Plan:   Post-operative Plan: Extubation in OR  Informed Consent: I have reviewed the patients History and Physical, chart, labs and discussed the procedure including the risks, benefits and alternatives for the proposed anesthesia with the patient or authorized representative who has indicated his/her understanding and acceptance.     Dental advisory given  Plan Discussed with:   Anesthesia Plan Comments: (PAT note  written 03/28/2024 by Yeimy Brabant, PA-C.  )         Anesthesia Quick Evaluation

## 2024-03-28 NOTE — Progress Notes (Signed)
 Anesthesia Chart Review: Norma Bender  Case: [id number] Date/Time: 03/30/24 0945   Procedure: LAPAROSCOPIC CHOLECYSTECTOMY - LAPAROSCOPIC CHOLECYSTECTOMY   Anesthesia type: General   Diagnosis: Calculus of gallbladder with chronic cholecystitis without obstruction [K80.10]   Pre-op diagnosis: gallstones   [facility]: [facility] ROOM 09 / [facility]   Surgeons: Vanderbilt Ned, MD       DISCUSSION: Patient is a 33 year old female scheduled for the above procedure.  History includes ever smoker, DM2 (diet controlled), hepatic steatosis, morbid obesity, GERD, iron deficiency anemia, cholelithiasis.  She had EGD and colonoscopy on 02/17/2024 for IDA and abdominal/epigastric pain. She had significant looping of the right colon, internal hemorrhoids and an ascending colon polyp removed (colonic mucosa with lymphoid aggregates, negative for dysplasia or malignancy), single duodenal polyp resected (duodenal mucosa with focal Brunner gland hyperplasia and lymphoid aggregates, negative for dysplasia or malignancy) otherwise normal duodenum, few gastric polyps samples (fundic gland polyp, no H pylori on HE stain, negative for intestinal metaplasia or dysplasia), and otherwise normal stomach and esophagus.   Anesthesia team to evaluate on the day of surgery. Last labs on 02/29/2024 showing glucose 105, Cr 0.74, normal LFTs, H/H 10.6/33.4 (stable), PLT 427K.    VS: Ht 5' 6 (1.676 m)   Wt (!) 155.4 kg   BMI 55.30 kg/m  BP Readings from Last 3 Encounters:  02/17/24 (!) 125/59  01/07/24 112/78  12/01/23 116/84   Pulse Readings from Last 3 Encounters:  02/17/24 88  01/07/24 97  12/01/23 (!) 123     PROVIDERS: Patient, No Pcp Per   LABS: For day of surgery as indicated. Most recent results in [facility] include: Lab Results  Component Value Date   WBC 10.2 02/29/2024   HGB 10.6 (L) 02/29/2024   HCT 33.4 (L) 02/29/2024   PLT 427.0 (H) 02/29/2024   GLUCOSE 105 (H) 02/29/2024   ALT 15 02/29/2024   AST 14  02/29/2024   NA 138 02/29/2024   K 3.6 02/29/2024   CL 103 02/29/2024   CREATININE 0.74 02/29/2024   BUN 8 02/29/2024   CO2 27 02/29/2024    IMAGES: US  Abd (RUQ) 12/02/2023: IMPRESSION: 1. Cholelithiasis without sonographic evidence of acute cholecystitis. 2. Increased echotexture of the liver. This is a nonspecific finding but can be seen in fatty infiltration of liver.    EKG: 10/09/2023: NSR   CV: N/A  Past Medical History:  Diagnosis Date   Cholelithiasis    Diabetes (HCC)    type 2   GERD (gastroesophageal reflux disease)    Hepatic steatosis    Iron deficiency anemia 12/2023   Obesity     Past Surgical History:  Procedure Laterality Date   COLONOSCOPY N/A 02/17/2024   Procedure: COLONOSCOPY;  Surgeon: Leigh Elspeth SQUIBB, MD;  Location: [facility];  Service: Gastroenterology;  Laterality: N/A;   ESOPHAGOGASTRODUODENOSCOPY N/A 02/17/2024   Procedure: EGD (ESOPHAGOGASTRODUODENOSCOPY);  Surgeon: Leigh Elspeth SQUIBB, MD;  Location: [facility];  Service: Gastroenterology;  Laterality: N/A;    MEDICATIONS: No current facility-administered medications for this encounter.    Barberry-Oreg Grape-Goldenseal (BERBERINE COMPLEX PO)   ferrous sulfate 324 MG TBEC   pantoprazole  (PROTONIX ) 40 MG tablet    Isaiah Ruder, PA-C Surgical Short Stay/Anesthesiology [facility] Phone [phone] [facility] Phone [phone] 03/28/2024 3:36 PM

## 2024-03-30 ENCOUNTER — Ambulatory Visit (HOSPITAL_BASED_OUTPATIENT_CLINIC_OR_DEPARTMENT_OTHER): Payer: Self-pay | Admitting: Vascular Surgery

## 2024-03-30 ENCOUNTER — Encounter (HOSPITAL_COMMUNITY)
Admission: RE | Disposition: A | Discharge status: Home / Self Care | Payer: Self-pay | Source: Home / Self Care | Attending: Surgery

## 2024-03-30 ENCOUNTER — Ambulatory Visit (HOSPITAL_COMMUNITY)
Admission: RE | Admit: 2024-03-30 | Discharge: 2024-03-30 | Disposition: A | Discharge status: Home / Self Care | Attending: Surgery | Admitting: Surgery

## 2024-03-30 ENCOUNTER — Ambulatory Visit (HOSPITAL_COMMUNITY): Payer: Self-pay | Admitting: Vascular Surgery

## 2024-03-30 ENCOUNTER — Other Ambulatory Visit: Payer: Self-pay

## 2024-03-30 ENCOUNTER — Encounter (HOSPITAL_COMMUNITY): Payer: Self-pay | Admitting: Surgery

## 2024-03-30 DIAGNOSIS — K429 Umbilical hernia without obstruction or gangrene: Secondary | ICD-10-CM | POA: Diagnosis not present

## 2024-03-30 DIAGNOSIS — D649 Anemia, unspecified: Secondary | ICD-10-CM | POA: Diagnosis not present

## 2024-03-30 DIAGNOSIS — K219 Gastro-esophageal reflux disease without esophagitis: Secondary | ICD-10-CM | POA: Insufficient documentation

## 2024-03-30 DIAGNOSIS — E119 Type 2 diabetes mellitus without complications: Secondary | ICD-10-CM

## 2024-03-30 DIAGNOSIS — K8010 Calculus of gallbladder with chronic cholecystitis without obstruction: Secondary | ICD-10-CM

## 2024-03-30 DIAGNOSIS — K760 Fatty (change of) liver, not elsewhere classified: Secondary | ICD-10-CM | POA: Diagnosis not present

## 2024-03-30 DIAGNOSIS — Z6841 Body Mass Index (BMI) 40.0 and over, adult: Secondary | ICD-10-CM | POA: Insufficient documentation

## 2024-03-30 DIAGNOSIS — E6689 Other obesity not elsewhere classified: Secondary | ICD-10-CM | POA: Insufficient documentation

## 2024-03-30 DIAGNOSIS — Z01818 Encounter for other preprocedural examination: Secondary | ICD-10-CM

## 2024-03-30 HISTORY — PX: CHOLECYSTECTOMY: SHX55

## 2024-03-30 HISTORY — PX: UMBILICAL HERNIA REPAIR: SHX196

## 2024-03-30 LAB — POCT PREGNANCY, URINE: Preg Test, Ur: NEGATIVE

## 2024-03-30 LAB — GLUCOSE, CAPILLARY
Glucose-Capillary: 96 mg/dL (ref 70–99)
Glucose-Capillary: 140 mg/dL — ABNORMAL HIGH (ref 70–99)

## 2024-03-30 SURGERY — LAPAROSCOPIC CHOLECYSTECTOMY
Anesthesia: General

## 2024-03-30 MED ORDER — MIDAZOLAM HCL 2 MG/2ML IJ SOLN
INTRAMUSCULAR | Status: DC | PRN
  Administered 2024-03-30: 2 mg via INTRAVENOUS

## 2024-03-30 MED ORDER — ACETAMINOPHEN 500 MG PO TABS
1000.0000 mg | ORAL_TABLET | ORAL | Status: AC
  Administered 2024-03-30: 1000 mg via ORAL
  Filled 2024-03-30: qty 2

## 2024-03-30 MED ORDER — PHENYLEPHRINE 80 MCG/ML (10ML) SYRINGE FOR IV PUSH (FOR BLOOD PRESSURE SUPPORT)
PREFILLED_SYRINGE | INTRAVENOUS | Status: DC | PRN
  Administered 2024-03-30: 80 ug via INTRAVENOUS
  Administered 2024-03-30 (×5): 160 ug via INTRAVENOUS
  Administered 2024-03-30: 80 ug via INTRAVENOUS
  Administered 2024-03-30: 160 ug via INTRAVENOUS
  Administered 2024-03-30: 80 ug via INTRAVENOUS

## 2024-03-30 MED ORDER — FENTANYL CITRATE (PF) 100 MCG/2ML IJ SOLN
25.0000 ug | INTRAMUSCULAR | Status: DC | PRN
  Administered 2024-03-30 (×4): 25 ug via INTRAVENOUS

## 2024-03-30 MED ORDER — SUCCINYLCHOLINE CHLORIDE 200 MG/10ML IV SOSY
PREFILLED_SYRINGE | INTRAVENOUS | Status: AC
  Filled 2024-03-30: qty 10

## 2024-03-30 MED ORDER — ROCURONIUM BROMIDE 10 MG/ML (PF) SYRINGE
PREFILLED_SYRINGE | INTRAVENOUS | Status: AC
  Filled 2024-03-30: qty 10

## 2024-03-30 MED ORDER — DEXAMETHASONE SODIUM PHOSPHATE 10 MG/ML IJ SOLN
INTRAMUSCULAR | Status: AC
  Filled 2024-03-30: qty 1

## 2024-03-30 MED ORDER — LIDOCAINE 2% (20 MG/ML) 5 ML SYRINGE
INTRAMUSCULAR | Status: AC
  Filled 2024-03-30: qty 5

## 2024-03-30 MED ORDER — SUGAMMADEX SODIUM 200 MG/2ML IV SOLN
INTRAVENOUS | Status: DC | PRN
  Administered 2024-03-30: 300 mg via INTRAVENOUS

## 2024-03-30 MED ORDER — FENTANYL CITRATE (PF) 250 MCG/5ML IJ SOLN
INTRAMUSCULAR | Status: AC
  Filled 2024-03-30: qty 5

## 2024-03-30 MED ORDER — CHLORHEXIDINE GLUCONATE CLOTH 2 % EX PADS: 6.0000 | MEDICATED_PAD | Freq: Once | CUTANEOUS | Status: DC

## 2024-03-30 MED ORDER — LIDOCAINE 2% (20 MG/ML) 5 ML SYRINGE
INTRAMUSCULAR | Status: DC | PRN
  Administered 2024-03-30: 100 mg via INTRAVENOUS

## 2024-03-30 MED ORDER — LACTATED RINGERS IV SOLN: INTRAVENOUS | Status: DC

## 2024-03-30 MED ORDER — PHENYLEPHRINE HCL-NACL 20-0.9 MG/250ML-% IV SOLN
INTRAVENOUS | Status: DC | PRN
  Administered 2024-03-30: 100 ug/min via INTRAVENOUS
  Administered 2024-03-30: 50 ug/min via INTRAVENOUS

## 2024-03-30 MED ORDER — FENTANYL CITRATE (PF) 100 MCG/2ML IJ SOLN
INTRAMUSCULAR | Status: AC
  Filled 2024-03-30: qty 2

## 2024-03-30 MED ORDER — ORAL CARE MOUTH RINSE: 15.0000 mL | Freq: Once | OROMUCOSAL | Status: AC

## 2024-03-30 MED ORDER — OXYCODONE HCL 5 MG PO TABS: 5.0000 mg | ORAL_TABLET | Freq: Four times a day (QID) | ORAL | 0 refills | Status: AC | PRN

## 2024-03-30 MED ORDER — SUCCINYLCHOLINE CHLORIDE 200 MG/10ML IV SOSY
PREFILLED_SYRINGE | INTRAVENOUS | Status: DC | PRN
  Administered 2024-03-30: 160 mg via INTRAVENOUS

## 2024-03-30 MED ORDER — SODIUM CHLORIDE 0.9 % IR SOLN
Status: DC | PRN
  Administered 2024-03-30: 1000 mL

## 2024-03-30 MED ORDER — CEFAZOLIN SODIUM-DEXTROSE 3-4 GM/150ML-% IV SOLN
3.0000 g | INTRAVENOUS | Status: AC
  Administered 2024-03-30: 3 g via INTRAVENOUS
  Filled 2024-03-30: qty 150

## 2024-03-30 MED ORDER — FENTANYL CITRATE (PF) 250 MCG/5ML IJ SOLN
INTRAMUSCULAR | Status: DC | PRN
  Administered 2024-03-30 (×2): 50 ug via INTRAVENOUS

## 2024-03-30 MED ORDER — ONDANSETRON HCL 4 MG/2ML IJ SOLN
INTRAMUSCULAR | Status: DC | PRN
  Administered 2024-03-30: 4 mg via INTRAVENOUS

## 2024-03-30 MED ORDER — MIDAZOLAM HCL 2 MG/2ML IJ SOLN
INTRAMUSCULAR | Status: AC
  Filled 2024-03-30: qty 2

## 2024-03-30 MED ORDER — OXYCODONE HCL 5 MG/5ML PO SOLN: 5.0000 mg | Freq: Once | ORAL | Status: DC | PRN

## 2024-03-30 MED ORDER — BUPIVACAINE-EPINEPHRINE 0.25% -1:200000 IJ SOLN
INTRAMUSCULAR | Status: DC | PRN
  Administered 2024-03-30: 15 mL

## 2024-03-30 MED ORDER — EPHEDRINE SULFATE-NACL 50-0.9 MG/10ML-% IV SOSY
PREFILLED_SYRINGE | INTRAVENOUS | Status: DC | PRN
  Administered 2024-03-30: 10 mg via INTRAVENOUS
  Administered 2024-03-30 (×2): 5 mg via INTRAVENOUS

## 2024-03-30 MED ORDER — KETOROLAC TROMETHAMINE 30 MG/ML IJ SOLN
INTRAMUSCULAR | Status: DC | PRN
  Administered 2024-03-30: 30 mg via INTRAVENOUS

## 2024-03-30 MED ORDER — INDOCYANINE GREEN 25 MG IV SOLR
7.5000 mg | Freq: Once | INTRAVENOUS | Status: AC
  Administered 2024-03-30: 7.5 mg via INTRAVENOUS

## 2024-03-30 MED ORDER — IBUPROFEN 800 MG PO TABS: 800.0000 mg | ORAL_TABLET | Freq: Three times a day (TID) | ORAL | 0 refills | Status: AC | PRN

## 2024-03-30 MED ORDER — OXYCODONE HCL 5 MG PO TABS: 5.0000 mg | ORAL_TABLET | ORAL | 0 refills | Status: AC | PRN

## 2024-03-30 MED ORDER — PHENYLEPHRINE 80 MCG/ML (10ML) SYRINGE FOR IV PUSH (FOR BLOOD PRESSURE SUPPORT)
PREFILLED_SYRINGE | INTRAVENOUS | Status: AC
  Filled 2024-03-30: qty 20

## 2024-03-30 MED ORDER — BUPIVACAINE-EPINEPHRINE (PF) 0.25% -1:200000 IJ SOLN
INTRAMUSCULAR | Status: AC
  Filled 2024-03-30: qty 30

## 2024-03-30 MED ORDER — OXYCODONE HCL 5 MG PO TABS: 5.0000 mg | ORAL_TABLET | Freq: Once | ORAL | Status: DC | PRN

## 2024-03-30 MED ORDER — DROPERIDOL 2.5 MG/ML IJ SOLN: 0.6250 mg | Freq: Once | INTRAMUSCULAR | Status: DC | PRN

## 2024-03-30 MED ORDER — PROPOFOL 10 MG/ML IV BOLUS
INTRAVENOUS | Status: DC | PRN
  Administered 2024-03-30: 250 mg via INTRAVENOUS

## 2024-03-30 MED ORDER — ACETAMINOPHEN 10 MG/ML IV SOLN: 1000.0000 mg | Freq: Once | INTRAVENOUS | Status: DC | PRN

## 2024-03-30 MED ORDER — EPHEDRINE 5 MG/ML INJ
INTRAVENOUS | Status: AC
  Filled 2024-03-30: qty 5

## 2024-03-30 MED ORDER — 0.9 % SODIUM CHLORIDE (POUR BTL) OPTIME
TOPICAL | Status: DC | PRN
  Administered 2024-03-30: 1000 mL

## 2024-03-30 MED ORDER — PROPOFOL 10 MG/ML IV BOLUS
INTRAVENOUS | Status: AC
  Filled 2024-03-30: qty 20

## 2024-03-30 MED ORDER — ROCURONIUM BROMIDE 10 MG/ML (PF) SYRINGE
PREFILLED_SYRINGE | INTRAVENOUS | Status: DC | PRN
  Administered 2024-03-30: 60 mg via INTRAVENOUS

## 2024-03-30 MED ORDER — DEXAMETHASONE SODIUM PHOSPHATE 10 MG/ML IJ SOLN
INTRAMUSCULAR | Status: DC | PRN
  Administered 2024-03-30: 5 mg via INTRAVENOUS

## 2024-03-30 MED ORDER — GABAPENTIN 300 MG PO CAPS
300.0000 mg | ORAL_CAPSULE | ORAL | Status: AC
  Administered 2024-03-30: 300 mg via ORAL
  Filled 2024-03-30: qty 1

## 2024-03-30 MED ORDER — ONDANSETRON HCL 4 MG/2ML IJ SOLN
INTRAMUSCULAR | Status: AC
  Filled 2024-03-30: qty 2

## 2024-03-30 MED ORDER — CHLORHEXIDINE GLUCONATE 0.12 % MT SOLN
15.0000 mL | Freq: Once | OROMUCOSAL | Status: AC
  Administered 2024-03-30: 15 mL via OROMUCOSAL
  Filled 2024-03-30: qty 15

## 2024-03-30 SURGICAL SUPPLY — 32 items
BAG COUNTER SPONGE SURGICOUNT (BAG) ×2 IMPLANT
BLADE CLIPPER SURG (BLADE) IMPLANT
CANISTER SUCTION 3000ML PPV (SUCTIONS) ×2 IMPLANT
CHLORAPREP W/TINT 26 (MISCELLANEOUS) ×2 IMPLANT
CLIP APPLIE ROT 10 11.4 M/L (STAPLE) ×2 IMPLANT
COVER SURGICAL LIGHT HANDLE (MISCELLANEOUS) ×2 IMPLANT
DERMABOND ADVANCED .7 DNX12 (GAUZE/BANDAGES/DRESSINGS) ×2 IMPLANT
ELECTRODE REM PT RTRN 9FT ADLT (ELECTROSURGICAL) ×2 IMPLANT
GLOVE BIO SURGEON STRL SZ8 (GLOVE) ×2 IMPLANT
GLOVE BIOGEL PI IND STRL 8 (GLOVE) ×2 IMPLANT
GOWN STRL REUS W/ TWL LRG LVL3 (GOWN DISPOSABLE) ×4 IMPLANT
GOWN STRL REUS W/ TWL XL LVL3 (GOWN DISPOSABLE) ×2 IMPLANT
IRRIGATION SUCT STRKRFLW 2 WTP (MISCELLANEOUS) ×2 IMPLANT
KIT BASIN OR (CUSTOM PROCEDURE TRAY) ×2 IMPLANT
KIT IMAGING PINPOINTPAQ (MISCELLANEOUS) IMPLANT
KIT TURNOVER KIT B (KITS) ×2 IMPLANT
NS IRRIG 1000ML POUR BTL (IV SOLUTION) ×2 IMPLANT
PAD ARMBOARD POSITIONER FOAM (MISCELLANEOUS) ×2 IMPLANT
POUCH RETRIEVAL ECOSAC 10 (ENDOMECHANICALS) ×2 IMPLANT
SCISSORS LAP 5X35 DISP (ENDOMECHANICALS) ×2 IMPLANT
SET TUBE SMOKE EVAC HIGH FLOW (TUBING) ×2 IMPLANT
SLEEVE Z-THREAD 5X100MM (TROCAR) ×2 IMPLANT
SPECIMEN JAR SMALL (MISCELLANEOUS) ×2 IMPLANT
SUT MNCRL AB 4-0 PS2 18 (SUTURE) ×2 IMPLANT
TOWEL GREEN STERILE (TOWEL DISPOSABLE) ×2 IMPLANT
TOWEL GREEN STERILE FF (TOWEL DISPOSABLE) ×2 IMPLANT
TRAY LAPAROSCOPIC MC (CUSTOM PROCEDURE TRAY) ×2 IMPLANT
TROCAR 11X100 Z THREAD (TROCAR) ×2 IMPLANT
TROCAR BALLN 12MMX100 BLUNT (TROCAR) ×2 IMPLANT
TROCAR Z-THREAD OPTICAL 5X100M (TROCAR) ×2 IMPLANT
WARMER LAPAROSCOPE (MISCELLANEOUS) ×2 IMPLANT
WATER STERILE IRR 1000ML POUR (IV SOLUTION) ×2 IMPLANT

## 2024-03-30 NOTE — H&P (Signed)
 History of Present Illness: Norma Bender is a 33 y.o. female who is seen today as an office consultation for evaluation of Cholelithiasis  65-year-old female sent for evaluation of epigastric abdominal pain since January. She has a history of epigastric abdominal pain and right upper quadrant abdominal pain after eating. She had a colonoscopy due to anemia which was normal. Her ultrasound was performed which showed gallstones. Her liver function studies are normal. The episodes of pain are becoming more frequent and associated with eating. Pain locations epigastrium and upper quadrant with radiation at times. It is moderate to severe in nature and lasts minutes to hours and sometimes days.  Review of Systems: A complete review of systems was obtained from the patient. I have reviewed this information and discussed as appropriate with the patient. See HPI as well for other ROS.    Medical History: Past Medical History:  Diagnosis Date  Anemia  Diabetes mellitus without complication (CMS/HHS-HCC)   There is no problem list on file for this patient.  History reviewed. No pertinent surgical history.   No Known Allergies  Current Outpatient Medications on File Prior to Visit  Medication Sig Dispense Refill  BERBERINE CHLORIDE ORAL Take by mouth  ferrous sulfate 325 (65 FE) MG EC tablet Take 325 mg by mouth daily with breakfast   No current facility-administered medications on file prior to visit.   History reviewed. No pertinent family history.   Social History   Tobacco Use  Smoking Status Never  Smokeless Tobacco Never    Social History   Socioeconomic History  Marital status: Single  Tobacco Use  Smoking status: Never  Smokeless tobacco: Never   Objective:   Vitals:  03/06/24 1117  BP: 122/72  Weight: (!) 147.6 kg (325 lb 6.4 oz)  Height: 167.6 cm (5' 6)  PainSc: 0-No pain   Body mass index is 52.52 kg/m.  Physical Exam Vitals reviewed. Exam conducted with a  chaperone present.  Cardiovascular:  Rate and Rhythm: Normal rate.  Abdominal:  General: Abdomen is flat. There is no distension.  Tenderness: There is no abdominal tenderness.  Hernia: No hernia is present.  Skin: General: Skin is warm.  Neurological:  General: No focal deficit present.  Mental Status: She is alert.     Labs, Imaging and Diagnostic Testing:  CLINICAL DATA: Right upper quadrant abdomen pain for 2 months.  EXAM: ULTRASOUND ABDOMEN LIMITED RIGHT UPPER QUADRANT  COMPARISON: None Available.  FINDINGS: Gallbladder:  Multiple gallstones are noted, largest measures 9.5 mm. No wall thickening visualized. No sonographic Murphy sign noted by sonographer.  Common bile duct:  Diameter: 3 mm  Liver:  No focal lesion. Increased echotexture. Portal vein is patent on color Doppler imaging with normal direction of blood flow towards the liver.  Other: None.  IMPRESSION: 1. Cholelithiasis without sonographic evidence of acute cholecystitis. 2. Increased echotexture of the liver. This is a nonspecific finding but can be seen in fatty infiltration of liver.   Electronically Signed By: Craig Farr M.D. On: 12/02/2023 08:43  Latest Reference Range & Units 02/21/24 10:37 02/29/24 10:54  WBC 4.0 - 10.5 K/uL 8.8 10.2  RBC 3.87 - 5.11 Mil/uL 4.21 4.41  Hemoglobin 12.0 - 15.0 g/dL 89.5 (L) 89.3 (L)  HCT 36.0 - 46.0 % 31.9 (L) 33.4 (L)  MCV 78.0 - 100.0 fl 75.8 (L) 75.7 (L)  MCHC 30.0 - 36.0 g/dL 67.3 68.1  RDW 88.4 - 84.4 % 18.7 (H) 18.0 (H)  Platelets 150.0 - 400.0 K/uL 350.0  427.0 (H)  Neutrophils 43.0 - 77.0 % 70.3 72.6  Lymphocytes 12.0 - 46.0 % 23.5 20.7  Monocytes Relative 3.0 - 12.0 % 4.7 5.8  Eosinophil 0.0 - 5.0 % 0.9 0.5  Basophil 0.0 - 3.0 % 0.6 0.4  NEUT# 1.4 - 7.7 K/uL 6.2 7.4  Lymphs Abs 0.7 - 4.0 K/uL 2.1 2.1  Monocyte # 0.1 - 1.0 K/uL 0.4 0.6  Eosinophils Absolute 0.0 - 0.7 K/uL 0.1 0.0  Basophils Absolute 0.0 - 0.1 K/uL 0.1 0.0  Glucose  70 - 99 mg/dL 894 (H)  (L): Data is abnormally low (H): Data is abnormally high Latest Reference Range & Units 02/29/24 10:54  Sodium 135 - 145 mEq/L 138  Potassium 3.5 - 5.1 mEq/L 3.6  Chloride 96 - 112 mEq/L 103  CO2 19 - 32 mEq/L 27  Glucose 70 - 99 mg/dL 894 (H)  BUN 6 - 23 mg/dL 8  Creatinine 9.59 - 8.79 mg/dL 9.25  Calcium 8.4 - 89.4 mg/dL 8.9  Alkaline Phosphatase 39 - 117 U/L 60  Albumin 3.5 - 5.2 g/dL 3.9  Lipase 88.9 - 40.9 U/L 11.0  AST 0 - 37 U/L 14  ALT 0 - 35 U/L 15  Total Protein 6.0 - 8.3 g/dL 7.4  Total Bilirubin 0.2 - 1.2 mg/dL 0.4  GFR >39.99 mL/min 105.70  (H): Data is abnormally high  Assessment and Plan:   Diagnoses and all orders for this visit:  Calculus of gallbladder with chronic cholecystitis without obstruction   Recommend laparoscopic cholecystectomy due to symptomatic cholelithiasis. The pathophysiology of the disease process and treatment options reviewed today with the patient. Medical and surgical options reviewed. She desires to proceed with laparoscopic cholecystectomy.The procedure has been discussed with the patient. Operative and non operative treatments have been discussed. Risks of surgery include bleeding, infection, Common bile duct injury, Injury to the stomach,liver, colon,small intestine, abdominal wall, Diaphragm, Major blood vessels, And the need for an open procedure. Other risks include worsening of medical problems, death, DVT and pulmonary embolism, and cardiovascular events. Medical options have also been discussed. The patient has been informed of long term expectations of surgery and non surgical options, The patient agrees to proceed.    DEBBY CURTISTINE SHIPPER, MD   I spent a total of 46 minutes in both face-to-face and non-face-to-face activities, excluding procedures performed, for this visit on the date of this encounter.

## 2024-03-30 NOTE — Discharge Instructions (Signed)
 [facility] ______[facility] LAPAROSCOPIC SURGERY: POST OP INSTRUCTIONS Always review your discharge instruction sheet given to you by the facility where your surgery was performed. IF YOU HAVE DISABILITY OR FAMILY LEAVE FORMS, YOU MUST BRING THEM TO THE OFFICE FOR PROCESSING.   DO NOT GIVE THEM TO YOUR DOCTOR.  A prescription for pain medication may be given to you upon discharge.  Take your pain medication as prescribed, if needed.  If narcotic pain medicine is not needed, then you may take acetaminophen  (Tylenol ) or ibuprofen  (Advil ) as needed. Take your usually prescribed medications unless otherwise directed. If you need a refill on your pain medication, please contact your pharmacy.  They will contact our office to request authorization. Prescriptions will not be filled after 5pm or on week-ends. You should follow a light diet the first few days after arrival home, such as soup and crackers, etc.  Be sure to include lots of fluids daily. Most patients will experience some swelling and bruising in the area of the incisions.  Ice packs will help.  Swelling and bruising can take several days to resolve.  It is common to experience some constipation if taking pain medication after surgery.  Increasing fluid intake and taking a stool softener (such as Colace) will usually help or prevent this problem from occurring.  A mild laxative (Milk of Magnesia or Miralax) should be taken according to package instructions if there are no bowel movements after 48 hours. Unless discharge instructions indicate otherwise, you may remove your bandages 24-48 hours after surgery, and you may shower at that time.  You may have steri-strips (small skin tapes) in place directly over the incision.  These strips should be left on the skin for 7-10 days.  If your surgeon used skin glue on the incision, you may shower in 24 hours.  The glue will flake off over the next 2-3 weeks.  Any sutures or staples will be  removed at the office during your follow-up visit. ACTIVITIES:  You may resume regular (light) daily activities beginning the next day--such as daily self-care, walking, climbing stairs--gradually increasing activities as tolerated.  You may have sexual intercourse when it is comfortable.  Refrain from any heavy lifting or straining until approved by your doctor. You may drive when you are no longer taking prescription pain medication, you can comfortably wear a seatbelt, and you can safely maneuver your car and apply brakes. RETURN TO WORK:  __________________________________________________________ Norma Bender should see your doctor in the office for a follow-up appointment approximately 2-3 weeks after your surgery.  Make sure that you call for this appointment within a day or two after you arrive home to insure a convenient appointment time. OTHER INSTRUCTIONS: __________________________________________________________________________________________________________________________ __________________________________________________________________________________________________________________________ WHEN TO CALL YOUR DOCTOR: Fever over 101.0 Inability to urinate Continued bleeding from incision. Increased pain, redness, or drainage from the incision. Increasing abdominal pain  The clinic staff is available to answer your questions during regular business hours.  Please don't hesitate to call and ask to speak to one of the nurses for clinical concerns.  If you have a medical emergency, go to the nearest emergency room or call 911.  A surgeon from [facility] is always on call at the hospital. [address], [address], [place], [place]  [address] ? [address] [address], [place], [place]   [address] [phone] ? [phone] ? FAX [phone] Web site: [email or web address]

## 2024-03-30 NOTE — Transfer of Care (Signed)
 Immediate Anesthesia Transfer of Care Note  Patient: Norma Bender  Procedure(s) Performed: LAPAROSCOPIC CHOLECYSTECTOMY REPAIR, HERNIA, UMBILICAL, ADULT  Patient Location: PACU  Anesthesia Type:General  Level of Consciousness: drowsy, patient cooperative, and responds to stimulation  Airway & Oxygen Therapy: Patient Spontanous Breathing and Patient connected to face mask oxygen  Post-op Assessment: Report given to RN and Patient moving all extremities X 4  Post vital signs: Reviewed and stable  Last Vitals:  Vitals Value Taken Time  BP 150/87 03/30/24 11:20  Temp 36.8 C 03/30/24 11:20  Pulse 104 03/30/24 11:23  Resp 19 03/30/24 11:23  SpO2 100 % 03/30/24 11:23  Vitals shown include unfiled device data.  Last Pain:  Vitals:   03/30/24 0814  TempSrc:   PainSc: 0-No pain         Complications: No notable events documented.

## 2024-03-30 NOTE — Op Note (Signed)
 Laparoscopic Cholecystectomy with ICG  Procedure Note, primary closure of umbilical hernia   Indications: This patient presents with symptomatic gallbladder disease and will undergo laparoscopic cholecystectomy.The procedure has been discussed with the patient. Operative and non operative treatments have been discussed. Risks of surgery include bleeding, infection,  Common bile duct injury,  Injury to the stomach,liver, colon,small intestine, abdominal wall,  Diaphragm,  Major blood vessels,  And the need for an open procedure.  Other risks include worsening of medical problems, death,  DVT and pulmonary embolism, and cardiovascular events.   Medical options have also been discussed. The patient has been informed of long term expectations of surgery and non surgical options,  The patient agrees to proceed.     Pre-operative Diagnosis: Calculus of gallbladder without mention of cholecystitis or obstruction  Post-operative Diagnosis: Same  Surgeon: Debby DELENA Shipper  MD   Assistants: OR staff  Anesthesia: General endotracheal anesthesia and Local anesthesia 0.25.% bupivacaine , with epinephrine   ASA Class: 2  Procedure Details  The patient was seen again in the Holding Room. The risks, benefits, complications, treatment options, and expected outcomes were discussed with the patient. The possibilities of reaction to medication, pulmonary aspiration, perforation of viscus, bleeding, recurrent infection, finding a normal gallbladder, the need for additional procedures, failure to diagnose a condition, the possible need to convert to an open procedure, and creating a complication requiring transfusion or operation were discussed with the patient. The patient and/or family concurred with the proposed plan, giving informed consent. The site of surgery properly noted/marked. The patient was taken to Operating Room, identified as Norma Bender and the procedure verified as Laparoscopic Cholecystectomy with  Intraoperative Cholangiograms. A Time Out was held and the above information confirmed.  Prior to the induction of general anesthesia, antibiotic prophylaxis was administered. General endotracheal anesthesia was then administered and tolerated well. After the induction, the abdomen was prepped in the usual sterile fashion. The patient was positioned in the supine position with the left arm comfortably tucked, along with some reverse Trendelenburg.  Local anesthetic agent was injected into the skin near the umbilicus and an incision made. This was performed in the supraumbilical position.  She has significant obesity and a 4 cm mass was encounter just above the umbilical fascia. This was opened and was a chronic umbilical hernia containing preperitoneal fat.  The sac was excised with cautery and this was passed off the field. This was used as the access port. Pursestring suture of 0 vicryl placed.  The midline fascia was incised and the Hasson technique was used to introduce a 12 mm port under direct vision.  Pneumoperitoneum was then created with CO2 and tolerated well without any adverse changes in the patient's vital signs. Additional trocars were introduced under direct vision with an 11 mm trocar in the epigastrium and 2 5 mm trocars in the right upper quadrant. All skin incisions were infiltrated with a local anesthetic agent before making the incision and placing the trocars.   The gallbladder was identified, the fundus grasped and retracted cephalad. Adhesions were lysed bluntly and with the electrocautery where indicated, taking care not to injure any adjacent organs or viscus. The infundibulum was grasped and retracted laterally, exposing the peritoneum overlying the triangle of Calot. This was then divided and exposed in a blunt fashion. The cystic duct was clearly identified and bluntly dissected circumferentially. The junctions of the gallbladder, cystic duct and common bile duct were clearly  identified prior to the division of any linear  structure.   ICG fluoroscopy utilized which showed the junction of the CBD cystic duct.  The CBD was not dilated. The critical view obtained around the junction of the cystic duct and gallbladder.The cystic duct was very small and would not accommodate a catheter.  LFT s were normal and no CBD dilation noted.        The cystic duct was then  ligated with surgical clips  on the patient side and  clipped on the gallbladder side and divided. The cystic artery was identified, dissected free, ligated with clips and divided as well. Posterior cystic artery clipped and divided.  The gallbladder was dissected from the liver bed in retrograde fashion with the electrocautery. The gallbladder was placed into an Ecosac. The liver bed was irrigated and inspected. Hemostasis was achieved with the electrocautery. Copious irrigation was utilized and was repeatedly aspirated until clear all particulate matter. Hemostasis was achieved with no signs  Of bleeding or bile leakage.The gallbladder was extracted via the umbilical port.An additional 1-0 novafil used to close the hernia defect which was 1.5 cm in diameter.   Pneumoperitoneum was completely reduced after viewing removal of the trocars under direct vision. The wound was thoroughly irrigated and the fascia was then closed with a figure of eight suture; the skin was then closed with 4 0 monocryl  and a sterile dressing of Dermabond  was applied.  Instrument, sponge, and needle counts were correct at closure and at the conclusion of the case.   Findings:  Cholelithiasis  Estimated Blood Loss: Minimal         Drains: none         Total IV Fluids: per OR record          Specimens: Gallbladder           Complications: None; patient tolerated the procedure well.         Disposition: PACU - hemodynamically stable.         Condition: stable

## 2024-03-30 NOTE — Interval H&P Note (Signed)
 History and Physical Interval Note:  03/30/2024 8:55 AM  Harlene CHRISTELLA Molt  has presented today for surgery, with the diagnosis of gallstones.  The various methods of treatment have been discussed with the patient and family. After consideration of risks, benefits and other options for treatment, the patient has consented to  Procedure(s) with comments: LAPAROSCOPIC CHOLECYSTECTOMY (N/A) - LAPAROSCOPIC CHOLECYSTECTOMY as a surgical intervention.  The patient's history has been reviewed, patient examined, no change in status, stable for surgery.  I have reviewed the patient's chart and labs.  Questions were answered to the patient's satisfaction.    The procedure has been discussed with the patient. Operative and non operative treatments have been discussed. Risks of surgery include bleeding, infection,  Common bile duct injury,  Injury to the stomach,liver, colon,small intestine, abdominal wall,  Diaphragm,  Major blood vessels,  And the need for an open procedure.  Other risks include worsening of medical problems, death,  DVT and pulmonary embolism, and cardiovascular events.   Medical options have also been discussed. The patient has been informed of long term expectations of surgery and non surgical options,  The patient agrees to proceed.    Helayna Dun A Alianna Wurster

## 2024-03-30 NOTE — Anesthesia Procedure Notes (Signed)
 Procedure Name: Intubation Date/Time: 03/30/2024 9:37 AM  Performed by: Jolynn Mage, CRNAPre-anesthesia Checklist: Patient identified, Patient being monitored, Timeout performed, Emergency Drugs available and Suction available Patient Re-evaluated:Patient Re-evaluated prior to induction Oxygen Delivery Method: Circle system utilized Preoxygenation: Pre-oxygenation with 100% oxygen Induction Type: IV induction and Rapid sequence Ventilation: Mask ventilation without difficulty Laryngoscope Size: Mac, 4 and Glidescope Grade View: Grade I Tube type: Oral Tube size: 7.0 mm Number of attempts: 1 Airway Equipment and Method: Stylet Placement Confirmation: ETT inserted through vocal cords under direct vision, positive ETCO2 and breath sounds checked- equal and bilateral Secured at: 22 cm Tube secured with: Tape Dental Injury: Teeth and Oropharynx as per pre-operative assessment

## 2024-03-31 ENCOUNTER — Encounter (HOSPITAL_COMMUNITY): Payer: Self-pay | Admitting: Surgery

## 2024-03-31 NOTE — Anesthesia Postprocedure Evaluation (Signed)
 Anesthesia Post Note  Patient: Norma Bender  Procedure(s) Performed: LAPAROSCOPIC CHOLECYSTECTOMY REPAIR, HERNIA, UMBILICAL, ADULT     Patient location during evaluation: PACU Anesthesia Type: General Level of consciousness: awake and alert Pain management: pain level controlled Vital Signs Assessment: post-procedure vital signs reviewed and stable Respiratory status: spontaneous breathing, nonlabored ventilation, respiratory function stable and patient connected to nasal cannula oxygen Cardiovascular status: blood pressure returned to baseline and stable Postop Assessment: no apparent nausea or vomiting Anesthetic complications: no   No notable events documented.  Last Vitals:  Vitals:   03/30/24 1300 03/30/24 1319  BP: (!) 146/93 (!) 152/93  Pulse: 98 93  Resp: 16 13  Temp:  36.9 C  SpO2: 97% 97%    Last Pain:  Vitals:   03/30/24 1200  TempSrc:   PainSc: Asleep                 Thom JONELLE Peoples

## 2024-04-03 LAB — SURGICAL PATHOLOGY
